# Patient Record
Sex: Female | Born: 1985 | Race: Black or African American | Hispanic: No | Marital: Married | Smoking: Never smoker
Health system: Southern US, Community
[De-identification: ages and names within clinical notes are randomized; demographics above are authoritative.]

## PROBLEM LIST (undated history)

## (undated) DIAGNOSIS — Z789 Other specified health status: Secondary | ICD-10-CM

## (undated) DIAGNOSIS — I10 Essential (primary) hypertension: Secondary | ICD-10-CM

## (undated) DIAGNOSIS — I82409 Acute embolism and thrombosis of unspecified deep veins of unspecified lower extremity: Secondary | ICD-10-CM

## (undated) HISTORY — PX: TONSILLECTOMY: SUR1361

## (undated) HISTORY — DX: Acute embolism and thrombosis of unspecified deep veins of unspecified lower extremity: I82.409

## (undated) HISTORY — DX: Other specified health status: Z78.9

## (undated) HISTORY — PX: OTHER SURGICAL HISTORY: SHX169

---

## 2006-02-22 ENCOUNTER — Emergency Department (HOSPITAL_COMMUNITY): Admission: EM | Admit: 2006-02-22 | Discharge: 2006-02-23 | Payer: Self-pay | Admitting: Emergency Medicine

## 2006-05-03 ENCOUNTER — Inpatient Hospital Stay (HOSPITAL_COMMUNITY): Admission: AD | Admit: 2006-05-03 | Discharge: 2006-05-03 | Payer: Self-pay | Admitting: Gynecology

## 2006-05-31 ENCOUNTER — Inpatient Hospital Stay (HOSPITAL_COMMUNITY): Admission: AD | Admit: 2006-05-31 | Discharge: 2006-05-31 | Payer: Self-pay | Admitting: Obstetrics & Gynecology

## 2006-05-31 ENCOUNTER — Ambulatory Visit: Payer: Self-pay | Admitting: Family Medicine

## 2006-06-14 ENCOUNTER — Inpatient Hospital Stay (HOSPITAL_COMMUNITY): Admission: AD | Admit: 2006-06-14 | Discharge: 2006-06-14 | Payer: Self-pay | Admitting: Gynecology

## 2006-06-30 ENCOUNTER — Ambulatory Visit: Payer: Self-pay | Admitting: Obstetrics & Gynecology

## 2006-07-01 ENCOUNTER — Ambulatory Visit: Payer: Self-pay | Admitting: Family Medicine

## 2006-07-01 ENCOUNTER — Ambulatory Visit (HOSPITAL_COMMUNITY): Admission: RE | Admit: 2006-07-01 | Discharge: 2006-07-01 | Payer: Self-pay | Admitting: Family Medicine

## 2006-07-04 ENCOUNTER — Encounter (INDEPENDENT_AMBULATORY_CARE_PROVIDER_SITE_OTHER): Payer: Self-pay | Admitting: *Deleted

## 2006-07-04 ENCOUNTER — Inpatient Hospital Stay (HOSPITAL_COMMUNITY): Admission: RE | Admit: 2006-07-04 | Discharge: 2006-07-07 | Payer: Self-pay | Admitting: Family Medicine

## 2006-07-04 ENCOUNTER — Ambulatory Visit: Payer: Self-pay | Admitting: Family Medicine

## 2006-07-13 ENCOUNTER — Inpatient Hospital Stay (HOSPITAL_COMMUNITY): Admission: AD | Admit: 2006-07-13 | Discharge: 2006-07-13 | Payer: Self-pay | Admitting: Obstetrics and Gynecology

## 2007-09-25 ENCOUNTER — Inpatient Hospital Stay (HOSPITAL_COMMUNITY): Admission: AD | Admit: 2007-09-25 | Discharge: 2007-09-25 | Payer: Self-pay | Admitting: Obstetrics and Gynecology

## 2007-11-15 ENCOUNTER — Inpatient Hospital Stay (HOSPITAL_COMMUNITY): Admission: AD | Admit: 2007-11-15 | Discharge: 2007-11-15 | Payer: Self-pay | Admitting: Obstetrics and Gynecology

## 2007-11-21 ENCOUNTER — Inpatient Hospital Stay (HOSPITAL_COMMUNITY): Admission: AD | Admit: 2007-11-21 | Discharge: 2007-11-21 | Payer: Self-pay | Admitting: Obstetrics and Gynecology

## 2007-12-01 ENCOUNTER — Encounter (INDEPENDENT_AMBULATORY_CARE_PROVIDER_SITE_OTHER): Payer: Self-pay | Admitting: Obstetrics and Gynecology

## 2007-12-01 ENCOUNTER — Inpatient Hospital Stay (HOSPITAL_COMMUNITY): Admission: RE | Admit: 2007-12-01 | Discharge: 2007-12-04 | Payer: Self-pay | Admitting: Obstetrics and Gynecology

## 2008-04-03 IMAGING — US US FETAL BPP W/O NONSTRESS
1 series · 18 of 23 positions shown · non-contrast
Comparison: none

CLINICAL DATA: 20-year-old.  G3 P2 with decreased fetal movement.  By assigned EDC of 07/15/06 the patient is 33 weeks and 4 days.

[Series 1: us fetal bpp w/o nonstress · non-contrast · 18 of 23 slices shown]
[im 1/23]
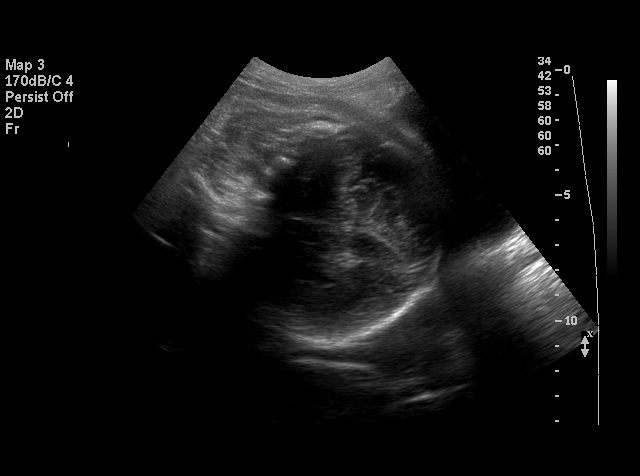
[im 2/23]
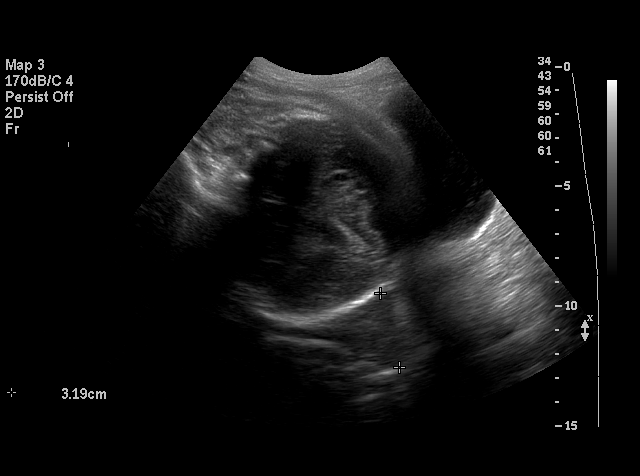
[im 4/23]
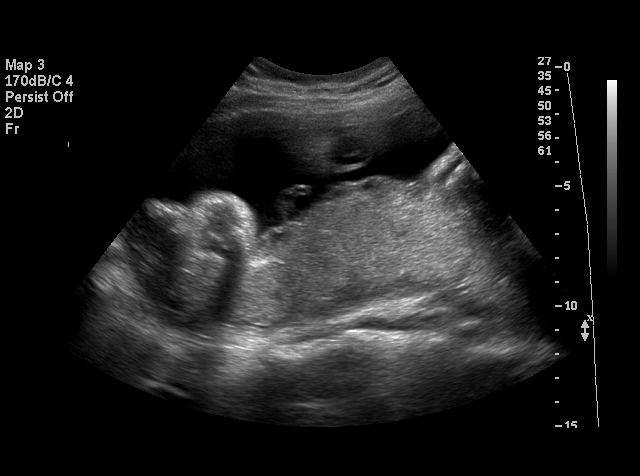
[im 5/23]
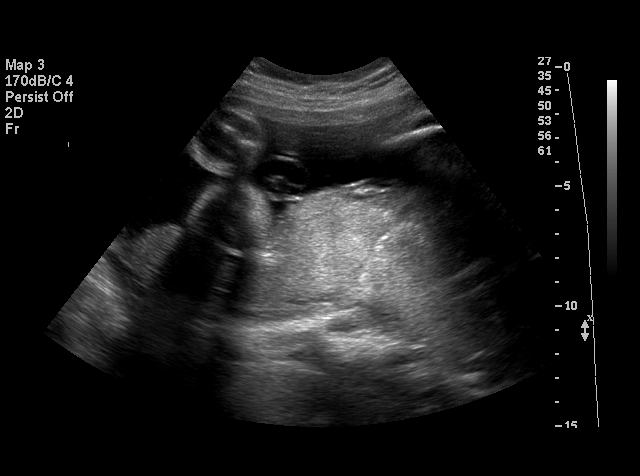
[im 6/23]
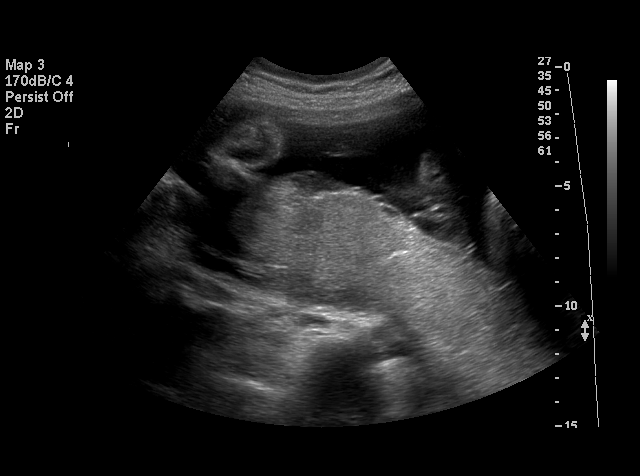
[im 8/23]
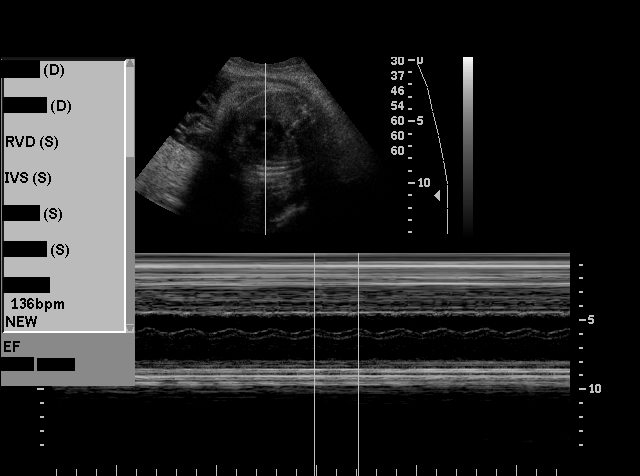
[im 9/23]
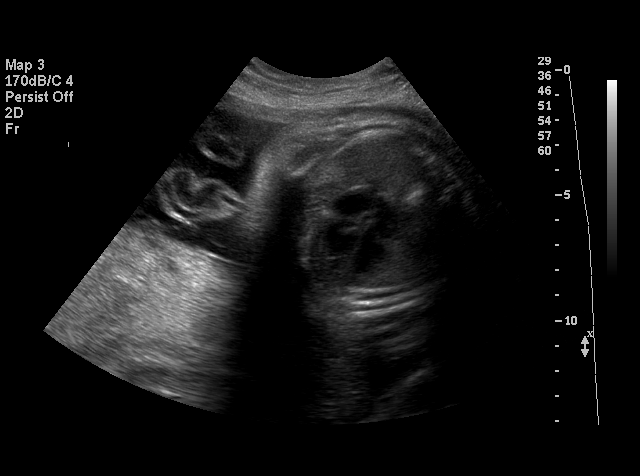
[im 10/23]
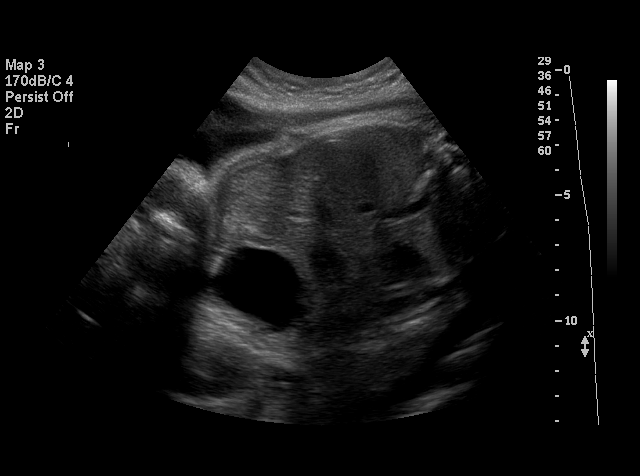
[im 11/23]
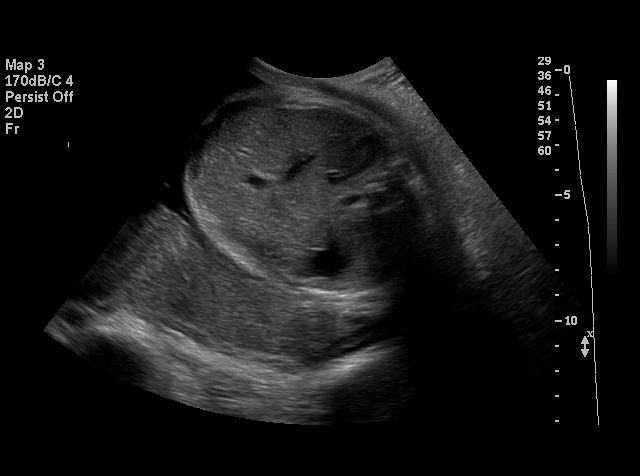
[im 13/23]
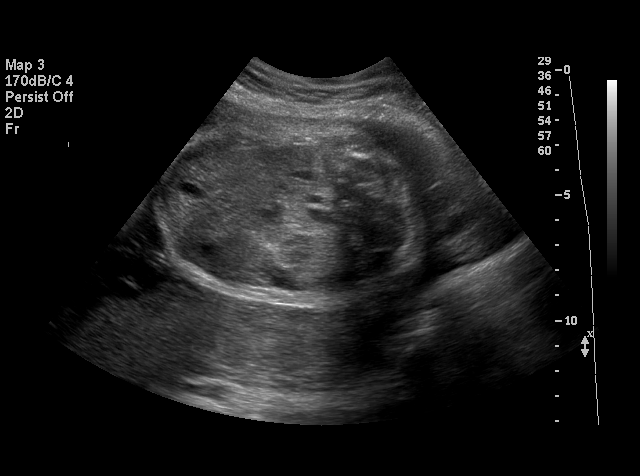
[im 14/23]
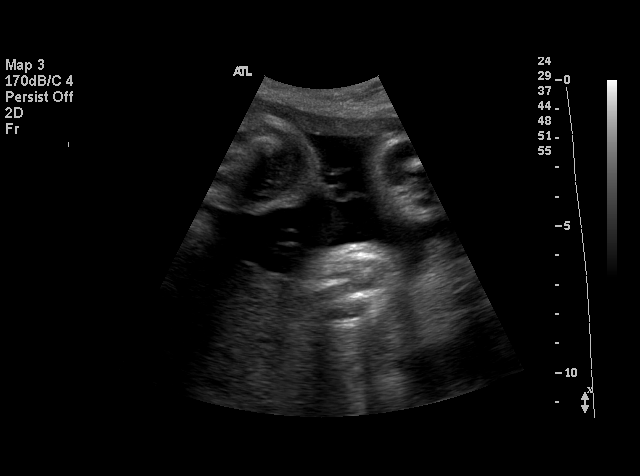
[im 15/23]
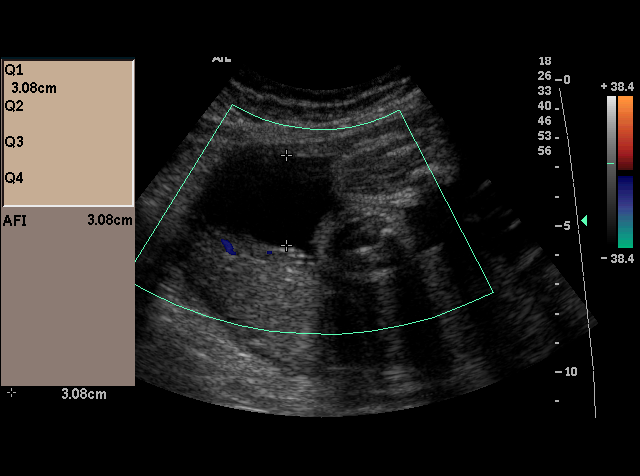
[im 16/23]
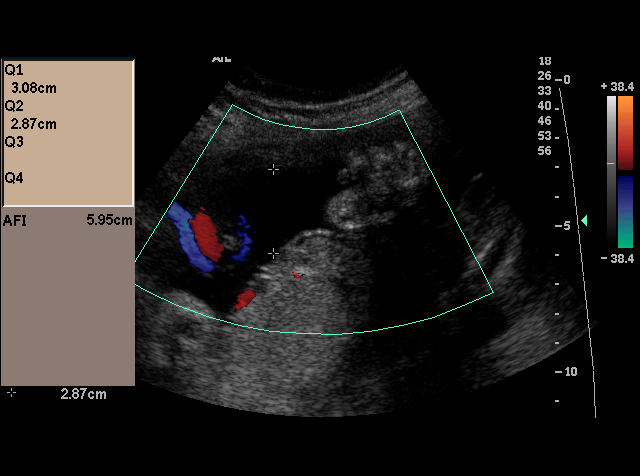
[im 18/23]
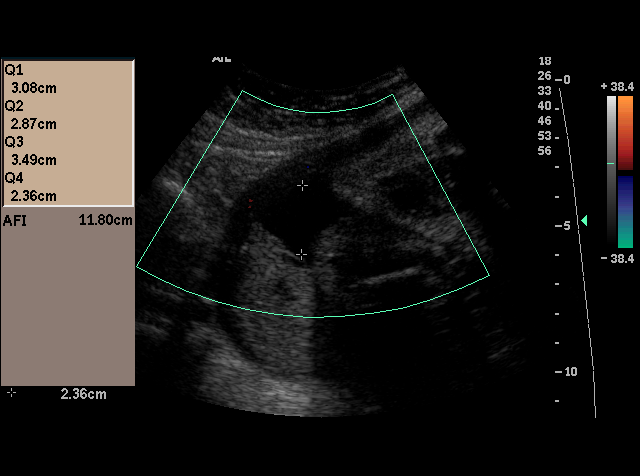
[im 19/23]
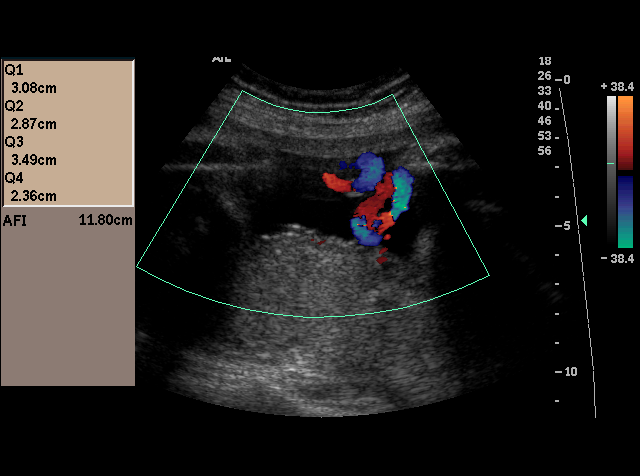
[im 20/23]
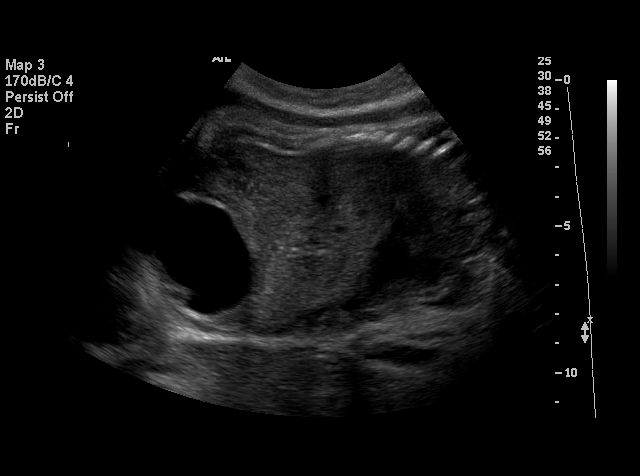
[im 22/23]
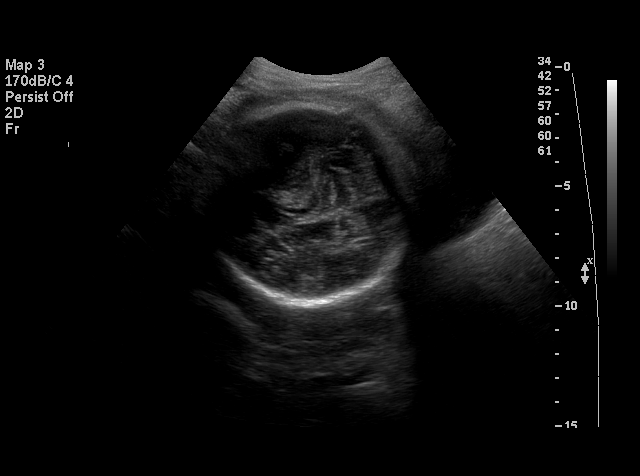
[im 23/23]
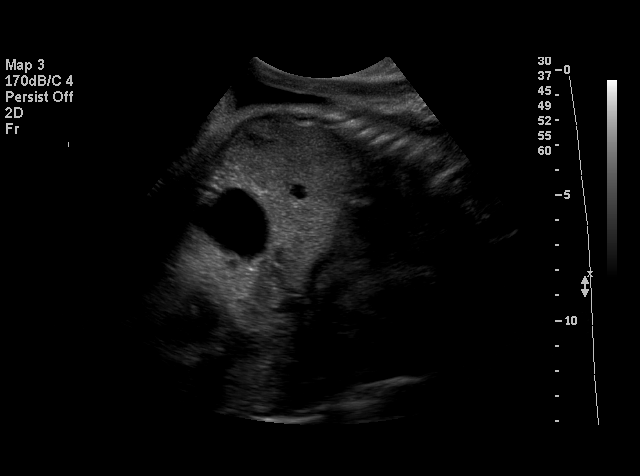

[18 of 23 positions shown; findings below may reference images not displayed]

BIOPHYSICAL PROFILE

 Number of Fetuses: 1
 Heart rate:  136
 Movement:  Yes
 Breathing:  Yes
 Presentation:  Cephalic
 Placental Location:  Posterior
 Grade: I
 Previa:  No
 Amniotic Fluid (Subjective):  Normal
 Amniotic Fluid (Objective):  11.8 cm AFI (5th -95th%ile = 8.1 ? 24.8 cm for 34 wks)

 Fetal measurements and complete anatomic evaluation were not requested.  The following fetal anatomy was visualized on this exam:  Lateral ventricles, stomach, 3-vessel cord, kidneys, and bladder.

 BPP SCORING
 Movements:  2  Time:  30 minutes
 Breathing:  2
 Tone:  2
 Amniotic Fluid:  2
 Total Score:  8

 MATERNAL UTERINE AND ADNEXAL FINDINGS
 Cervix:  3.2 cm transabdominally
IMPRESSION: Single living intrauterine fetus in cephalic presentation.  Amniotic fluid volume is within normal limits.  Biophysical profile is 8 of 8 in 30 minutes.

## 2008-12-29 ENCOUNTER — Emergency Department (HOSPITAL_COMMUNITY): Admission: EM | Admit: 2008-12-29 | Discharge: 2008-12-29 | Payer: Self-pay | Admitting: Emergency Medicine

## 2009-02-05 ENCOUNTER — Emergency Department (HOSPITAL_COMMUNITY): Admission: EM | Admit: 2009-02-05 | Discharge: 2009-02-05 | Payer: Self-pay | Admitting: Emergency Medicine

## 2009-08-23 ENCOUNTER — Inpatient Hospital Stay (HOSPITAL_COMMUNITY): Admission: AD | Admit: 2009-08-23 | Discharge: 2009-08-23 | Payer: Self-pay | Admitting: Obstetrics & Gynecology

## 2009-10-19 ENCOUNTER — Emergency Department (HOSPITAL_COMMUNITY): Admission: EM | Admit: 2009-10-19 | Discharge: 2009-10-19 | Payer: Self-pay | Admitting: Emergency Medicine

## 2010-01-29 ENCOUNTER — Ambulatory Visit (HOSPITAL_COMMUNITY): Admission: RE | Admit: 2010-01-29 | Discharge: 2010-01-29 | Payer: Self-pay | Admitting: Obstetrics

## 2010-01-30 ENCOUNTER — Ambulatory Visit (HOSPITAL_COMMUNITY): Admission: RE | Admit: 2010-01-30 | Discharge: 2010-01-30 | Payer: Self-pay | Admitting: Obstetrics & Gynecology

## 2010-01-30 ENCOUNTER — Ambulatory Visit: Payer: Self-pay | Admitting: Vascular Surgery

## 2010-01-30 ENCOUNTER — Encounter: Payer: Self-pay | Admitting: Obstetrics & Gynecology

## 2010-02-05 ENCOUNTER — Ambulatory Visit: Payer: Self-pay | Admitting: Oncology

## 2010-02-10 ENCOUNTER — Ambulatory Visit: Payer: Self-pay | Admitting: Hematology & Oncology

## 2010-02-20 LAB — CHCC SATELLITE - SMEAR

## 2010-02-20 LAB — CBC WITH DIFFERENTIAL (CANCER CENTER ONLY)
BASO#: 0 10*3/uL (ref 0.0–0.2)
BASO%: 0.6 % (ref 0.0–2.0)
LYMPH#: 2.3 10*3/uL (ref 0.9–3.3)
LYMPH%: 42.5 % (ref 14.0–48.0)
MCH: 21.9 pg — ABNORMAL LOW (ref 26.0–34.0)
MCHC: 31.8 g/dL — ABNORMAL LOW (ref 32.0–36.0)
MCV: 69 fL — ABNORMAL LOW (ref 81–101)
MONO#: 0.3 10*3/uL (ref 0.1–0.9)
NEUT#: 2.5 10*3/uL (ref 1.5–6.5)
NEUT%: 47.6 % (ref 39.6–80.0)
RDW: 16.7 % — ABNORMAL HIGH (ref 10.5–14.6)

## 2010-03-01 LAB — JAK2 GENOTYPR: JAK2 GenotypR: NOT DETECTED

## 2010-03-14 ENCOUNTER — Ambulatory Visit: Payer: Self-pay | Admitting: Hematology & Oncology

## 2010-04-03 ENCOUNTER — Emergency Department (HOSPITAL_COMMUNITY): Admission: EM | Admit: 2010-04-03 | Discharge: 2010-04-03 | Payer: Self-pay | Admitting: Emergency Medicine

## 2010-11-20 ENCOUNTER — Emergency Department (HOSPITAL_COMMUNITY)
Admission: EM | Admit: 2010-11-20 | Discharge: 2010-11-20 | Payer: Self-pay | Source: Home / Self Care | Admitting: Family Medicine

## 2010-11-28 ENCOUNTER — Ambulatory Visit (HOSPITAL_COMMUNITY)
Admission: RE | Admit: 2010-11-28 | Discharge: 2010-11-28 | Payer: Self-pay | Source: Home / Self Care | Attending: Obstetrics & Gynecology | Admitting: Obstetrics & Gynecology

## 2010-11-29 NOTE — Op Note (Signed)
  Elizabeth Chen, Elizabeth Chen            ACCOUNT NO.:  [id number]  MEDICAL RECORD NO.:  [id number]          PATIENT TYPE:  AMB  LOCATION:  SDC                           FACILITY:  [facility]  PHYSICIAN:  Roseanna Rainbow, M.D.DATE OF BIRTH:  12-11-1985  DATE OF PROCEDURE:  11/28/2010 DATE OF DISCHARGE:  11/28/2010                              OPERATIVE REPORT   PREOPERATIVE DIAGNOSIS:  Abnormal uterine bleeding.  POSTOPERATIVE DIAGNOSIS:  Abnormal uterine bleeding.  PROCEDURES:  Diagnostic dilatation and curettage, hysteroscopy, NovaSure endometrial ablation.  SURGEON:  Roseanna Rainbow, M.D.  ANESTHESIA:  Laryngeal mask airway, paracervical block.  URINE OUTPUT:  200 mL of clear urine at the beginning of the procedure.  IV FLUIDS:  As per anesthesiology.  FINDINGS:  The endometrium appeared lush.  The tubal ostia were visualized bilaterally.  There were no discrete lesions noted. Pathology endometrial curettings.  All specimens sent to Pathology.  ESTIMATED BLOOD LOSS:  100 mL.  COMPLICATIONS:  Cervical laceration from the tenaculum.  PROCEDURE:  The patient was taken to the operating room with an IV running.  A laryngeal mask airway was then placed.  She was placed in the dorsal lithotomy position and prepped and draped in the usual sterile fashion.  After time-out had been completed, a speculum was placed in the patient's vagina.  The anterior lip of the cervix was then infiltrated with 2 mL of 2% Xylocaine plain.  The single-tooth tenaculum was then applied to this location.  A 4 mL of 2% lidocaine plain were then injected at 4 and 7 o'clock to produce a paracervical block.  The cervical length was then assessed.  The cervix was dilated with Shawnie Pons dilators.  The hysteroscope was then introduced into the uterine cavity with the above findings noted.  The hysteroscope was then removed.  The uterus was sounded.  A sharp curettage was then performed.  The NovaSure device  was then introduced into the uterine cavity.  The device was seated.  On the initial attempt, the cavity integrity test could not be passed.  However, there was tubing connection that was not connected properly, however.  When the tubing connection was connected properly, the cavity integrity test was passed.  The ablation cycle was started and completed.  The hysteroscope was then reintroduced into the uterine cavity with a good result noted.  The above-noted cervical laceration was repaired with an interrupted figure- of-eight suture of 2-0 Monocryl.  The single-tooth tenaculum was removed with minimal bleeding noted from the cervix.  At the close of the procedure, the instrument and pack counts were said to be correct x2. The patient was taken to the PACU awake and in stable condition.     Roseanna Rainbow, M.D.     [name]  D:  11/28/2010  T:  11/29/2010  Job:  [id number]  Electronically Signed by Antionette Char M.D. on 11/29/2010 02:19:37 PM

## 2010-11-30 ENCOUNTER — Inpatient Hospital Stay (HOSPITAL_COMMUNITY)
Admission: AD | Admit: 2010-11-30 | Discharge: 2010-12-04 | Payer: Self-pay | Source: Home / Self Care | Attending: Obstetrics & Gynecology | Admitting: Obstetrics & Gynecology

## 2010-12-01 LAB — CBC
Hemoglobin: 12.4 g/dL (ref 12.0–15.0)
MCH: 24.9 pg — ABNORMAL LOW (ref 26.0–34.0)
WBC: 6.2 10*3/uL (ref 4.0–10.5)

## 2010-12-02 LAB — BASIC METABOLIC PANEL
CO2: 28 mEq/L (ref 19–32)
Creatinine, Ser: 0.92 mg/dL (ref 0.4–1.2)
Potassium: 3.4 mEq/L — ABNORMAL LOW (ref 3.5–5.1)
Sodium: 137 mEq/L (ref 135–145)

## 2010-12-02 LAB — CBC
HCT: 35 % — ABNORMAL LOW (ref 36.0–46.0)
HCT: 31.5 % — ABNORMAL LOW (ref 36.0–46.0)
Hemoglobin: 11.1 g/dL — ABNORMAL LOW (ref 12.0–15.0)
MCH: 25.2 pg — ABNORMAL LOW (ref 26.0–34.0)
MCHC: 32.1 g/dL (ref 30.0–36.0)
MCV: 79.4 fL (ref 78.0–100.0)
Platelets: 252 10*3/uL (ref 150–400)
RBC: 4.41 MIL/uL (ref 3.87–5.11)
RBC: 3.97 MIL/uL (ref 3.87–5.11)
RDW: 13.2 % (ref 11.5–15.5)
RDW: 13.3 % (ref 11.5–15.5)
WBC: 16.5 10*3/uL — ABNORMAL HIGH (ref 4.0–10.5)

## 2010-12-02 LAB — DIFFERENTIAL
Basophils Absolute: 0.1 10*3/uL (ref 0.0–0.1)
Neutro Abs: 18 10*3/uL — ABNORMAL HIGH (ref 1.7–7.7)
Neutrophils Relative %: 86 % — ABNORMAL HIGH (ref 43–77)

## 2010-12-03 NOTE — H&P (Addendum)
  NAMEBRAYLI, Elizabeth Chen            ACCOUNT NO.:  [id number]  MEDICAL RECORD NO.:  [id number]          PATIENT TYPE:  OBV  LOCATION:  9318                          [facility]ACILITY:  [facility]  PHYSICIAN:  Roseanna Rainbow, M.D.DATE OF BIRTH:  1986-02-04  DATE OF ADMISSION:  11/30/2010 DATE OF DISCHARGE:                             HISTORY & PHYSICAL   CHIEF COMPLAINT:  The patient is a 25 year old several-day status post a NovaSure endometrial ablation complaining of headache, nausea, vomiting, and chills.  HISTORY OF PRESENT ILLNESS:  Please see the above.  She reports significant pelvic cramping 1 day prior to presentation.  This had improved on the day of presentation.  PAST MEDICAL HISTORY:  There is a history of deep venous thrombosis in March of 2011.  She was on Coumadin for 6 months, no anticoagulants at present. Migraine headaches.  PAST SURGICAL HISTORY:  Cesarean delivery.  Please see the above . Bilateral tubal ligation.  SOCIAL HISTORY:  She is a nonsmoker, nondrinker.  No recreational drugs. Lives with his daughter and spouse.  PAST GYN HISTORY:  No history of sexually transmitted infections.  MEDICATIONS:  Please see the medication reconciliation form.  ALLERGIES:  LATEX.  REVIEW OF SYSTEMS:  GENERAL:  Chills.  GI: Nausea, vomiting, diarrhea. GENITOURINARY:  Pelvic cramping.  She denies significant vaginal bleeding or vaginal discharge.  PHYSICAL EXAMINATION:  VITAL SIGNS:  Temperature 101.2, pulse 119, respirations 18, blood pressure 110/69. GENERAL:  Well-nourished, uncomfortable appearing. ABDOMEN:  Soft.  Normoactive bowel sounds.  No rebound or guarding. PELVIC:  Per the CNM.  LABORATORY DATA:  Basic metabolic profile normal.  White blood cell count 21.2, hemoglobin 11.1, platelets 252,000.  Abdominal x-ray, no free air.  Otherwise negative study.  ASSESSMENT:  Status post NovaSure ablation with endometritis, possible endomyometritis, doubt perforation,  also with a migraine headache.  PLAN:  Admission, parenteral antibiotics, supportive care, serial exams, and lab work.     Roseanna Rainbow, M.D.     Elizabeth Chen  D:  12/01/2010  T:  12/01/2010  Job:  [id number]  Electronically Signed by Antionette Char M.D. on 12/03/2010 09:23:03 PM

## 2010-12-07 LAB — CULTURE, BLOOD (ROUTINE X 2)
Culture  Setup Time: 201201230006
Culture: NO GROWTH
Culture: NO GROWTH

## 2010-12-08 NOTE — Discharge Summary (Signed)
  Elizabeth Chen, Elizabeth Chen            ACCOUNT NO.:  [id number]  MEDICAL RECORD NO.:  [id number]          PATIENT TYPE:  INP  LOCATION:  9318                          [facility]ILITY:  [facility]  PHYSICIAN:  Roseanna Rainbow, M.D.DATE OF BIRTH:  [birth year]  DATE OF ADMISSION:  11/30/2010 DATE OF DISCHARGE:  12/04/2010                              DISCHARGE SUMMARY   CHIEF COMPLAINT:  The patient is a 25 year old several days status post NovaSure endometrial ablation, complaining of headache, nausea, vomiting, and chills.  Please see the dictated history and physical for further details.  HOSPITAL COURSE:  The patient was admitted.  She was started on broad- spectrum parenteral antibiotics.  The patient defervesced and remained afebrile for the remainder of her hospital course.  Her white blood cell count was trending downward.  An initial white blood cell count on November 30, 2010, was 21.2; on December 01, 2010, a white blood cell count was 16.5.  The abdominal exam remained stable as well.  Blood cultures were negative.  DISCHARGE DIAGNOSIS:  Endomyometritis status post NovaSure ablation.  CONDITION:  Improved, stable.  DIET:  Regular.  ACTIVITY:  Pelvic rest.  MEDICATIONS: 1. Augmentin 875 mg 1 tab p.o. b.i.d. 2. Over-the-counter ibuprofen as needed. 3. Percocet 1-2 tabs every 6 hours as needed.  DISPOSITION:  The patient was to follow up in the office in 2 weeks.     Roseanna Rainbow, M.D.     [name]  D:  12/04/2010  T:  12/04/2010  Job:  [id number]  Electronically Signed by Antionette Char M.D. on 12/08/2010 09:19:20 AM

## 2011-01-26 LAB — POCT I-STAT, CHEM 8
BUN: 7 mg/dL (ref 6–23)
Calcium, Ion: 1.11 mmol/L — ABNORMAL LOW (ref 1.12–1.32)
Chloride: 105 mEq/L (ref 96–112)
Glucose, Bld: 81 mg/dL (ref 70–99)
HCT: 42 % (ref 36.0–46.0)
Sodium: 140 mEq/L (ref 135–145)

## 2011-01-26 LAB — URINALYSIS, ROUTINE W REFLEX MICROSCOPIC
Specific Gravity, Urine: 1.018 (ref 1.005–1.030)
pH: 8.5 — ABNORMAL HIGH (ref 5.0–8.0)

## 2011-01-26 LAB — POCT PREGNANCY, URINE: Preg Test, Ur: NEGATIVE

## 2011-01-26 LAB — URINE MICROSCOPIC-ADD ON

## 2011-02-10 LAB — POCT I-STAT, CHEM 8
Creatinine, Ser: 0.7 mg/dL (ref 0.4–1.2)
Glucose, Bld: 75 mg/dL (ref 70–99)
Hemoglobin: 13.6 g/dL (ref 12.0–15.0)
Potassium: 3 mEq/L — ABNORMAL LOW (ref 3.5–5.1)

## 2011-02-12 LAB — CBC
HCT: 34.2 % — ABNORMAL LOW (ref 36.0–46.0)
Hemoglobin: 10.8 g/dL — ABNORMAL LOW (ref 12.0–15.0)
MCHC: 31.6 g/dL (ref 30.0–36.0)
MCV: 76.7 fL — ABNORMAL LOW (ref 78.0–100.0)
Platelets: 290 10*3/uL (ref 150–400)
RBC: 4.45 MIL/uL (ref 3.87–5.11)
RDW: 16.8 % — ABNORMAL HIGH (ref 11.5–15.5)
WBC: 11.2 10*3/uL — ABNORMAL HIGH (ref 4.0–10.5)

## 2011-02-12 LAB — URINALYSIS, ROUTINE W REFLEX MICROSCOPIC
Bilirubin Urine: NEGATIVE
Hgb urine dipstick: NEGATIVE
Specific Gravity, Urine: 1.01 (ref 1.005–1.030)
Urobilinogen, UA: 0.2 mg/dL (ref 0.0–1.0)
pH: 8 (ref 5.0–8.0)

## 2011-02-12 LAB — WET PREP, GENITAL
Trich, Wet Prep: NONE SEEN
Yeast Wet Prep HPF POC: NONE SEEN

## 2011-02-12 LAB — GC/CHLAMYDIA PROBE AMP, GENITAL: GC Probe Amp, Genital: NEGATIVE

## 2011-02-12 LAB — HCG, SERUM, QUALITATIVE: Preg, Serum: NEGATIVE

## 2011-03-24 NOTE — Op Note (Signed)
NAMECOLLEN, HOSTLER            ACCOUNT NO.:  [id number]   MEDICAL RECORD NO.:  [id number]          PATIENT TYPE:  INP   LOCATION:  9199                          [facility]ACILITY:  [facility]   PHYSICIAN:  Juluis Mire, M.D.   DATE OF BIRTH:  06-24-1986   DATE OF PROCEDURE:  12/01/2007  DATE OF DISCHARGE:                               OPERATIVE REPORT   PREOPERATIVE DIAGNOSES:  1. Intrauterine pregnancy at 37+ weeks.  2. Repeat cesarean section.  3. Fetal pulmonary maturity.  4. Multiparity, desires sterility.   POSTOPERATIVE DIAGNOSES:  1. Intrauterine pregnancy at 37+ weeks.  2. Repeat cesarean section.  3. Fetal pulmonary maturity.  4. Multiparity, desires sterility.   OPERATIVE PROCEDURES:  1. Low transverse cesarean section.  2. Bilateral tubal ligation.   SURGEON:  Juluis Mire, M.D.   ANESTHESIA:  Spinal.   ESTIMATED BLOOD LOSS:  400-500 mL.   PACKS AND DRAINS:  None.   INTRAOPERATIVE BLOOD REPLACED:  None.   COMPLICATIONS:  None.   INDICATIONS:  Are as dictated in the history and physical.   PROCEDURE:  The patient was taken the OR, placed in supine position with  left lateral tilt.  After a satisfactory level of spinal anesthesia was  obtained, the abdomen was prepped out with Betadine and draped as a  sterile field.  A prior low transverse skin incision was identified and  excised.  The incision was then extended through subcutaneous tissue.  Rectus fascia was entered sharply and the incision in the fascia  extended laterally.  Fascia was taken off the muscle superiorly and  inferiorly.  Rectus muscles were separated in the midline.  Peritoneum  was entered sharply and the incision in the perineum extended both  superiorly and inferiorly.  A low transverse bladder flap developed.  A  low transverse uterine incision was begun with a knife and extended  laterally using manual traction.  The infant was in the vertex  presentation and was delivered with elevation of  the head and fundal  pressure.  The infant was a viable female who weighed 8 pounds 5 ounces,  Apgars were 8/9.  The pH is pending.  Placenta was delivered manually.  Uterus was exteriorized for closure.  Uterus closed with a running  locking suture of 0 chromic using a two-layer closure technique.  Good  hemostasis was noted.  Urine output was clear.   Tubes were identified.  A midsegment of each tube was elevated.  A hole  was made in the avascular area of the mesosalpinx using the cautery.  Individual ligatures of 0 plain catgut were used to ligate off a segment  of tube.  The intervening segment tube was excised.  The cut ends of the  tubes were cauterized using the Bovie.  We had good hemostasis.  Both  ovaries were normal.  The uterus was returned to the abdominal cavity.  We irrigated the pelvis, had good hemostasis, clear urine output.  Muscles and peritoneum closed with a running suture of 3-0 Vicryl.  Fascia was closed with a running suture of 0 PDS.  Skin was closed with  staples and Steri-Strips.  Sponge, instrument and needle count reported  as correct by circulating nurse x2.  Foley catheter remained clear at  time of closure.  The patient did tolerate the procedure well, was  returned to the recovery room in good condition.      Juluis Mire, M.D.  Electronically Signed     JSM/MEDQ  D:  12/01/2007  T:  12/01/2007  Job:  [id number]

## 2011-03-24 NOTE — H&P (Signed)
Elizabeth Chen, Elizabeth Chen            ACCOUNT NO.:  [id number]   MEDICAL RECORD NO.:  [id number]          PATIENT TYPE:  INP   LOCATION:  NA                            [facility]   PHYSICIAN:  Juluis Mire, M.D.   DATE OF BIRTH:  Nov 07, 1986   DATE OF ADMISSION:  12/01/2007  DATE OF DISCHARGE:                              HISTORY & PHYSICAL   The patient is a 25 year old gravida 4, para 3 female whose last  menstrual period gives her an estimated date of confinement of December 14, 2007.  This is consistent with initial exam, and this gives her an  estimated gestational age of approximately 37-38 weeks.  She presents  for repeat cesarean section.   The patient had a previous cesarean section with the last pregnancy.  We  had discussed a trial of labor declined by the patient.  She wishes  repeat cesarean section.  She also desires permanent sterilization at  the present time.  Because of the discomfort, she wanted a section done  before 39 weeks.  An amniocentesis was performed with a mature LS and  PG.  Last estimated fetal weight was 8 pounds 9 ounces.  Amniotic fluid  was in the 15th percentile.  Recent nonstress test was reactive.   ALLERGIES:  She is allergic to LATEX.   MEDICATIONS:  Prenatal vitamins.   PAST MEDICAL HISTORY/FAMILY HISTORY/SOCIAL HISTORY:  Please see prenatal  records.   REVIEW OF SYSTEMS:  Noncontributory.   PHYSICAL EXAMINATION:  VITAL SIGNS:  The patient is afebrile with stable  vital signs.  HEENT:  The patient is normocephalic.  Pupils equal, round and reactive  to light and accommodation.  Extraocular movements were intact.  Sclerae  and conjunctivae were clear.  Oropharynx clear.  NECK:  Without thyromegaly.  BREASTS:  Not examined.  LUNGS:  Clear.  CARDIOVASCULAR:  Regular rhythm and rate with a grade 2/6 systolic  ejection murmur.  No clicks or gallops.  ABDOMEN:  Gravid uterus consistent with dates.  PELVIC:  Cervix is 2 cm, 50% effaced,  vertex presenting.  EXTREMITIES:  Trace edema.  NEUROLOGIC:  Grossly within normal limits.   IMPRESSION:  1. Intrauterine pregnancy at 37 weeks.  Mature fetal pulmonary      studies.  2. Previous cesarean section, desires repeat.  3. Multiparity; desires sterility.   PLAN:  The patient will undergo repeat cesarean section.  The risks have  been explained including the risk of infection, the risk of hemorrhage  that could require transfusion with the risk of AIDS or hepatitis, the  risk of injury to adjacent organs including bladder, bowel or ureters  that could require further exploratory surgery, the risk of deep  venous thrombosis and pulmonary embolus.  Potential irreversibility of  sterilization explained.  The failure rate of 1 in 300 was quoted with  risk being in the form of ectopic pregnancy requiring further surgical  management.  Alternatives for birth control have been explained.      Juluis Mire, M.D.  Electronically Signed     JSM/MEDQ  D:  12/01/2007  T:  12/01/2007  Job:  [id number]

## 2011-03-27 NOTE — Discharge Summary (Signed)
NAMECLEMENCE, Elizabeth Chen            ACCOUNT NO.:  [id number]   MEDICAL RECORD NO.:  [id number]          PATIENT TYPE:  INP   LOCATION:  9103                          [facility]   PHYSICIAN:  Michelle L. Grewal, M.D.DATE OF BIRTH:  06/03/1986   DATE OF ADMISSION:  12/01/2007  DATE OF DISCHARGE:  12/04/2007                               DISCHARGE SUMMARY   ADMISSION DIAGNOSES:  1. Intrauterine pregnancy at 37+ weeks' estimated gestational age.  2. Previous cesarean section.  3. Fetal lung maturity.  4. Multiparity, desires permanent sterilization.   DISCHARGE DIAGNOSES:  1. Status post low transverse cesarean section.  2. A viable female infant.  3. Permanent sterilization.   PROCEDURES:  1. Low transverse cesarean section.  2. Bilateral tubal ligation.   REASON FOR ADMISSION:  Please see dictated H&P.   HOSPITAL COURSE:  The patient was a 25 year old gravida 4, para 3, that  was admitted to [facility] for a scheduled cesarean  section.  The patient had undergone amniocentesis which had revealed  mature LS and PG.  The patient due to multiparity had also requested  permanent sterilization.  On the morning of admission the patient was  taken to the operating room. where spinal anesthesia was administered  without difficulty.  A low transverse incision was made with delivery of  a viable female infant weighing 8 pounds 5 ounces, Apgars of 8 at one  minute, 9 at 5 minutes.  The patient tolerated the procedure well and  was taken to the recovery room in stable condition.  On postoperative  day #1 the patient was without complaint.  Vital signs were stable.  She  was afebrile.  Fundus firm and nontender.  Abdominal dressing was noted  to have a small amount of drainage noted on the bandage.  Foley had been  discontinued and the patient was voiding well.  Laboratory findings  revealed hemoglobin of 7.4, platelet count of 262,000, WBC count of  16.4.  Hemoglobin  on admission was 8.6.  The patient was started on some  iron supplementation and IV was reduced to a saline lock.  On  postoperative day #2 the patient was doing well.  She denied any  dizziness.  Vital signs were stable.  She is afebrile.  Abdomen soft.  Fundus firm and nontender.  Abdominal dressing had been removed  revealing an incision that was clean, dry and intact.  Later that  evening the patient did complain of some diarrhea.  The patient was  started on some Lomotil.  On the following morning the diarrhea was  improved and vital signs were stable.  She was afebrile.  Fundus firm  and nontender.  Incision was clean, dry and intact.  Staples removed and  the patient was later discharged home.   CONDITION ON DISCHARGE:  Good.   DIET:  Regular as tolerated.   ACTIVITY:  No heavy lifting, no driving x2 weeks, no vaginal entry.   FOLLOW-UP:  Patient to follow up in the office in 1 week for an incision  check.  She is to call for a temperature greater than 100  degrees,  persistent nausea or vomiting, heavy vaginal bleeding and/or redness or  drainage from the incisional site.   DISCHARGE MEDICATIONS:  1. Tylox #30 one p.o. every 4-6 hours p.r.n.  2. Motrin 600 mg every 6 hours.  3. Ferrous sulfate 325 mg one p.o. b.i.d.  4. Prenatal vitamins one p.o. daily.      Julio Sicks, N.P.      Stann Mainland. Vincente Poli, M.D.  Electronically Signed    CC/MEDQ  D:  12/15/2007  T:  12/16/2007  Job:  [id number]

## 2011-03-27 NOTE — Discharge Summary (Signed)
Elizabeth Chen, FLIGHT            ACCOUNT NO.:  [id number]   MEDICAL RECORD NO.:  [id number]          PATIENT TYPE:  INP   LOCATION:  9125                          [facility]ILITY[facility]   PHYSICIAN:  Phil D. Okey Dupre, M.D.     DATE OF BIRTH:  01-18-1986   DATE OF ADMISSION:  07/04/2006  DATE OF DISCHARGE:  07/07/2006                                 DISCHARGE SUMMARY   REASON FOR ADMISSION:  Onset of labor.   PROCEDURES PRENATAL:  None.   PROCEDURES INTRAPARTUM:  Cesarean, low cervical transverse.   PROCEDURES POSTPARTUM:  None.   COMPLICATIONS, OPERATIVE AND POSTPARTUM:  None.   DISCHARGE DIAGNOSIS:  Term pregnancy, delivered.   BRIEF HOSPITAL COURSE:  Patient is a 25 year old G3, P2, 0-0-2 who presented  at [redacted] weeks EGA with spontaneous onset of labor.  The patient was admitted  to L&D per protocol.  In discussion of patient's obstetrics history, it was  noted the patient had a previous incident of a three minute shoulder  dystocia following precipitous labor in the [facility].  The patient had poor to no  prenatal care with this pregnancy.  Risks of a repeat shoulder dystocia were  discussed with patient, and the decision was made to take the patient to  elective cesarean section.  The patient delivered a viable female with  Apgars of 8 and 9 by cesarean section without intraoperative or  postoperative complications.  The patient was transferred to labor and  delivery following her surgery and had routine postoperative course.   Postoperatively, the patient was noted to have a decline in her hemoglobin  from 8.7 preop baseline to 7.2 postop.  Patient was asymptomatic and  hemodynamically stable.  The patient was sent home on iron supplementation  secondary to a hemoglobin less than 8.  Patient desires BTL for  contraception but did not have papers signed, so the patient is scheduled  for a two week preop followup at the GYN clinic with a six week interval  BTL.  Patient was covered with  Depo-Provera for contraception during this  interval.  Lastly, secondary to a concern of elevated blood sugars during  the hospital stay but documented only with a one hour Glucola of 135,  recommendation is also made for the patient to have a two hour glucose  tolerance test at 6 weeks postpartum.   DISCHARGE INSTRUCTIONS:  Activity limitation:  No heavy lifting for six  weeks.  Six weeks pelvic rest.  Routine diet.   DISCHARGE MEDICATIONS:  1. Ibuprofen 600 mg q.6h. p.r.n. pain.  2. Percocet 5/325 1-2 tabs every 4-6 hours as needed for pain.  3. Colace 100 mg b.i.d. for constipation.   DISCHARGE INSTRUCTIONS:  Routine discharge to home.   FOLLOW UP:  1. Two weeks at GYN clinic for preop interval BTL examination.  2. Six weeks at low risk clinic for routine postoperative followup.  3. Patient's staples were removed on postop day #3 prior to discharge.      Patient does not need followup for staple removal.      Towana Badger, M.D.    ______________________________  Phil D. Okey Dupre, M.D.    JP/MEDQ  D:  07/07/2006  T:  07/07/2006  Job:  [id number]

## 2011-03-27 NOTE — Op Note (Signed)
NAMEMALISHA, Elizabeth Chen            ACCOUNT NO.:  [id number]   MEDICAL RECORD NO.:  [id number]          PATIENT TYPE:  WOC   LOCATION:  WOC                          FACILITY:  [facility]   PHYSICIAN:  Tanya S. Shawnie Pons, M.D.   DATE OF BIRTH:  07/02/1986   DATE OF PROCEDURE:  07/04/2006  DATE OF DISCHARGE:                                 OPERATIVE REPORT   PREOPERATIVE DIAGNOSIS:  History of shoulder, in labor.   POSTOPERATIVE DIAGNOSIS:  History of shoulder, in labor.   PROCEDURE:  Primary low transverse cesarean section.   SURGEON:  Shelbie Proctor. Shawnie Pons, M.D.   ASSISTANT:  __________.   ANESTHESIA:  Spinal and local.   FINDINGS:  Viable female infant, Apgars 8 and 9, weight 7 pounds 6 ounces.   ESTIMATED BLOOD LOSS:  750 cubic centimeters.   COMPLICATIONS:  None.   SPECIMENS:  Placenta to pathology.   INDICATIONS:  The patient is an 25 year old, gravida 3 para 2, who had a 9-  pound 15-ounce infant with gestational diabetes in a previous pregnancy and  had a significant shoulder dystocia.  The patient was seen prior to this and  had discussed the risk of shoulder dystocia.  She had a recent ultrasound  that showed the baby to be approximately 7-1/2 pounds.  A full discussion  was held with the patient including risks, benefits, and risk of repeat  shoulder dystocia being very unlikely and the patient had previously had a 7-  pound infant without any difficulty.  However, the patient decided to  proceed with primary C-section.  The risks of this procedure, including  bleeding, need for transfusion, injury to bowel or bladder, infection, and  even death, were all discussed with the patient.   DESCRIPTION OF PROCEDURE:  The patient was taken to the OR.  She was placed  in the supine position.  After spinal anesthesia was administered, she was  prepped and draped in the usual sterile fashion.  When anesthesia was found  to be adequate with __________  testing, a Pfannenstiel incision was  made  with the knife to the underlying fascia which was nicked in the midline.  The fascial incision was extended laterally using Mayo scissors laterally.  The anterior portion of the fascia was dissected off the __________  and  rectus bluntly laterally and sharply in the midline.  The rectus was then  divided bluntly in the midline, the peritoneal cavity entered bluntly.  The  peritoneal incision was made larger by the surgeon and assistant.  A bladder  blade was placed inside the cavity.  A knife was used to make a low  transverse incision on the uterus.  The amniotic cavity was entered.  Clear  amniotic fluid was noted.  The infant was in the vertex presentation,  __________ bulb suctioned on the abdomen, and delivered without difficulty.  The cord was clamped x 2 and cut.  The infant was taken to awaiting  pediatrics.  Positive spontaneous cry heard.  Cord blood was obtained.  The  placenta was delivered easily and the uterus cleaned out with dry lap pads.  The  edges of the uterine incision were then grasped with ring forceps and  closed with 0-Vicryl incision in a locked running fashion.  A second  imbricating layer was then placed over this.  When hemostasis was found to  be adequate, attention was turned to the tubes and ovaries which appeared  normal.  Again, the uterine incision was inspected and found to be  hemostatic.  The fascia was then closed with a 0-Vicryl suture in a running  fashion.  Subcutaneous tissue was irrigated, any bleeders cauterized, and  the skin closed using clips.  20 cubic centimeters of 0.5% Marcaine plain  were then used to infiltrate the incision.  All instrument and lap counts  were correct x 3.  The patient was awakened and taken to the recovery room  in stable condition.           ______________________________  Shelbie Proctor Shawnie Pons, M.D.     TSP/MEDQ  D:  07/04/2006  T:  07/05/2006  Job:  [id number]

## 2011-07-30 LAB — CBC
HCT: 23.7 — ABNORMAL LOW
HCT: 23.7 — ABNORMAL LOW
Hemoglobin: 7.4 — CL
Hemoglobin: 7.6 — CL
MCHC: 32
Platelets: 252
Platelets: 262
RBC: 3.71 — ABNORMAL LOW
RDW: 16.7 — ABNORMAL HIGH
WBC: 15 — ABNORMAL HIGH
WBC: 11.5 — ABNORMAL HIGH
WBC: 16.4 — ABNORMAL HIGH

## 2011-07-30 LAB — KLEIHAUER-BETKE STAIN: Fetal Cells %: 0

## 2011-07-30 LAB — RPR: RPR Ser Ql: NONREACTIVE

## 2011-08-18 LAB — URINALYSIS, ROUTINE W REFLEX MICROSCOPIC
Bilirubin Urine: NEGATIVE
Glucose, UA: NEGATIVE
Hgb urine dipstick: NEGATIVE
Specific Gravity, Urine: 1.01
Urobilinogen, UA: 0.2

## 2011-09-09 ENCOUNTER — Encounter: Payer: Self-pay | Admitting: Pulmonary Disease

## 2011-09-10 ENCOUNTER — Ambulatory Visit (INDEPENDENT_AMBULATORY_CARE_PROVIDER_SITE_OTHER): Payer: Self-pay | Admitting: Internal Medicine

## 2011-09-10 ENCOUNTER — Encounter: Payer: Self-pay | Admitting: Internal Medicine

## 2011-09-10 VITALS — BP 122/88 | HR 88 | Temp 98.2°F | Ht 69.0 in | Wt 189.0 lb

## 2011-09-10 DIAGNOSIS — R05 Cough: Secondary | ICD-10-CM

## 2011-09-10 NOTE — Patient Instructions (Addendum)
1. Stop advair today 2. Continue delsym as needed for cough. You can also use albuterol but not more than 3-4 times daily 3. In 2 weeks have methacholine challenge test (you cannot be pregnant for this test). Need to be off advair for this test 4. REturn to followup after methacholine challenge test 5. Bring the actual copy of your chest xray from your doctor's office when you get back - not the result but the actual film  6. Any worsening call us anytime or return for visit

## 2011-09-10 NOTE — Progress Notes (Signed)
Subjective:    Patient ID: Elizabeth Chen, female    DOB: 1986-06-08, 25 y.o.   MRN: [id number]  HPI  25 year old AA female. Non-smoker. Strong family hx of asthma (brother and daughter). 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 Is on phone entire time except for 30 min lunch break. She has hx of childhood asthma but outgrew  In early July 2012 developed cough. Insidious onset. Does not remember URI but thinks cough was related to that. Associated dyspnea + at onset. In early August 2012: saw Dr Cyndia Bent. CXR reportedly showed pneumonia. Was given antibiotics and 10 day steroids and advair with albuterol. Felt better for a week but then cough recurred.  Cough and dyspnea made significantly worse by cold air (in office wearing mask). Has been on albuterol since august for prn use but using it atleast 3 times a day; this helps significantly but still feels dyspneic. Denies associated wheezing. Has nocturnal cough - wakes up multiple times each night and uses albuterol. Dyspnea is non-exertional Symptoms persisted and referred to pulmonary in late august but seeing PCCM only today; she does not know reason for delay. Since the referral was made she has been staying at home mostly, not visited pmd and not gone to work. Of note, she is not sure if advair disc since august 2012 is helping.   Denies associated sinus drainage, gerd, hypertension, ace inhibitor intake, and  repeat cxr. Spirometry today is normal.   Spirometry in office today is normal   Review of Systems  Constitutional: Positive for unexpected weight change. Negative for fever.  HENT: Positive for congestion. Negative for ear pain, sore throat, rhinorrhea, sneezing, trouble swallowing, dental problem, postnasal drip and sinus pressure.   Eyes: Negative for itching.  Respiratory: Positive for cough, chest tightness, shortness of breath and wheezing. Negative for apnea, choking and stridor.     Cardiovascular: Negative for palpitations and leg swelling.  Gastrointestinal: Negative for nausea and vomiting.  Genitourinary: Negative for dysuria.  Musculoskeletal: Negative for joint swelling.  Skin: Negative for rash.  Neurological: Negative for dizziness and headaches.  Hematological: Bruises/bleeds easily.  Psychiatric/Behavioral: Negative for dysphoric mood. The patient is nervous/anxious.        Objective:   Physical Exam  Vitals reviewed. Constitutional: She is oriented to person, place, and time. She appears well-developed and well-nourished. No distress.       Wearing mask in office to prevent inhalation of cold air  HENT:  Head: Normocephalic and atraumatic.  Right Ear: External ear normal.  Left Ear: External ear normal.  Mouth/Throat: Oropharynx is clear and moist. No oropharyngeal exudate.  Eyes: Conjunctivae and EOM are normal. Pupils are equal, round, and reactive to light. Right eye exhibits no discharge. Left eye exhibits no discharge. No scleral icterus.  Neck: Normal range of motion. Neck supple. No JVD present. No tracheal deviation present. No thyromegaly present.       Hoarse voice  Cardiovascular: Normal rate, regular rhythm, normal heart sounds and intact distal pulses.  Exam reveals no gallop and no friction rub.   No murmur heard. Pulmonary/Chest: Effort normal and breath sounds normal. No respiratory distress. She has no wheezes. She has no rales. She exhibits no tenderness.       Panting style of conversation  Abdominal: Soft. Bowel sounds are normal. She exhibits no distension and no mass. There is no tenderness. There is no rebound and no guarding.  Musculoskeletal: Normal range of motion. She exhibits no edema and no tenderness.       Palms are sweaty and moist  Lymphadenopathy:    She has no cervical adenopathy.  Neurological: She is alert and oriented to person, place, and time. She has normal reflexes. No cranial nerve deficit. She exhibits  normal muscle tone. Coordination normal.  Skin: Skin is warm and dry. No rash noted. She is not diaphoretic. No erythema. No pallor.       tatoos  Psychiatric: Judgment and thought content normal.       anxious          Assessment & Plan:

## 2011-09-10 NOTE — Assessment & Plan Note (Signed)
?   LPR cough, ? Asthma, ? VCD, ? GERD Related  PLAN 1. Stop advair today 2. Continue delsym as needed for cough. You can also use albuterol but not more than 3-4 times daily 3. In 2 weeks have methacholine challenge test (you cannot be pregnant for this test). Need to be off advair for this test 4. REturn to followup after methacholine challenge test 5. Bring the actual copy of your chest xray from your doctor's office when you get back - not the result but the actual film  6. Any worsening call us anytime or return for visit  She is agreeable with above plan

## 2011-09-22 ENCOUNTER — Telehealth: Payer: Self-pay | Admitting: Pulmonary Disease

## 2011-09-22 NOTE — Telephone Encounter (Signed)
Elizabeth Chen have you seen any forms on this pt? Please advise, thanks

## 2011-09-22 NOTE — Telephone Encounter (Signed)
I have not seen this paperwork. I will look for it tomorrow. Carron Curie, CMA

## 2011-09-23 NOTE — Telephone Encounter (Signed)
LMTCB with [employer] to see if they have the papers because I have not seen them. Carron Curie, CMA

## 2011-09-24 NOTE — Telephone Encounter (Signed)
I spoke with [employer] and they state they do not have any paperwork on this pt. I called pt and made her aware of this and she states she will get the paperwork re-sent.

## 2011-09-25 ENCOUNTER — Ambulatory Visit (HOSPITAL_COMMUNITY)
Admission: RE | Admit: 2011-09-25 | Discharge: 2011-09-25 | Disposition: A | Discharge status: Home / Self Care | Payer: BC Managed Care – PPO | Source: Ambulatory Visit | Attending: Internal Medicine | Admitting: Internal Medicine

## 2011-09-25 DIAGNOSIS — R05 Cough: Secondary | ICD-10-CM | POA: Insufficient documentation

## 2011-09-25 DIAGNOSIS — R059 Cough, unspecified: Secondary | ICD-10-CM | POA: Insufficient documentation

## 2011-09-25 LAB — PULMONARY FUNCTION TEST

## 2011-09-25 MED ORDER — ALBUTEROL SULFATE (5 MG/ML) 0.5% IN NEBU
2.5000 mg | INHALATION_SOLUTION | Freq: Once | RESPIRATORY_TRACT | Status: AC
  Administered 2011-09-25: 2.5 mg via RESPIRATORY_TRACT

## 2011-09-25 MED ORDER — METHACHOLINE 4 MG/ML NEB SOLN: 3.0000 mL | Freq: Once | RESPIRATORY_TRACT | Status: DC

## 2011-09-25 MED ORDER — METHACHOLINE 0.25 MG/ML NEB SOLN
3.0000 mL | Freq: Once | RESPIRATORY_TRACT | Status: AC
  Administered 2011-09-25: 0.75 mg via RESPIRATORY_TRACT

## 2011-09-25 MED ORDER — METHACHOLINE 0.0625 MG/ML NEB SOLN
3.0000 mL | Freq: Once | RESPIRATORY_TRACT | Status: AC
  Administered 2011-09-25: 0.1875 mg via RESPIRATORY_TRACT

## 2011-09-25 MED ORDER — METHACHOLINE 1 MG/ML NEB SOLN: 3.0000 mL | Freq: Once | RESPIRATORY_TRACT | Status: DC

## 2011-09-25 MED ORDER — METHACHOLINE 16 MG/ML NEB SOLN: 3.0000 mL | Freq: Once | RESPIRATORY_TRACT | Status: DC

## 2011-09-25 MED ORDER — SODIUM CHLORIDE 0.9 % IN NEBU
3.0000 mL | INHALATION_SOLUTION | Freq: Once | RESPIRATORY_TRACT | Status: AC
  Administered 2011-09-25: 3 mL via RESPIRATORY_TRACT

## 2011-10-06 ENCOUNTER — Telehealth: Payer: Self-pay | Admitting: Internal Medicine

## 2011-10-06 NOTE — Telephone Encounter (Signed)
Pt is aware that any extended time out of work would need to come from the physician that put her out of work. Pt verbalized understanding of this. She did request a copy of her last OV note and this was sent.

## 2011-10-07 ENCOUNTER — Ambulatory Visit: Payer: Self-pay | Admitting: Internal Medicine

## 2011-10-21 ENCOUNTER — Ambulatory Visit (INDEPENDENT_AMBULATORY_CARE_PROVIDER_SITE_OTHER): Payer: BC Managed Care – PPO | Admitting: Internal Medicine

## 2011-10-21 ENCOUNTER — Encounter: Payer: Self-pay | Admitting: *Deleted

## 2011-10-21 ENCOUNTER — Encounter: Payer: Self-pay | Admitting: Internal Medicine

## 2011-10-21 VITALS — BP 122/98 | HR 111 | Temp 98.6°F | Ht 69.0 in | Wt 189.2 lb

## 2011-10-21 DIAGNOSIS — R05 Cough: Secondary | ICD-10-CM

## 2011-10-21 MED ORDER — BUDESONIDE-FORMOTEROL FUMARATE 160-4.5 MCG/ACT IN AERO: 2.0000 | INHALATION_SPRAY | Freq: Two times a day (BID) | RESPIRATORY_TRACT | Status: DC

## 2011-10-21 MED ORDER — MONTELUKAST SODIUM 10 MG PO TABS: 10.0000 mg | ORAL_TABLET | Freq: Every day | ORAL | Status: DC

## 2011-10-21 MED ORDER — PREDNISONE 10 MG PO TABS: ORAL_TABLET | ORAL | Status: DC

## 2011-10-21 NOTE — Progress Notes (Signed)
Subjective:    Patient ID: Elizabeth Chen, female    DOB: 19-Dec-1985, 25 y.o.   MRN: [id number]  HPI 25 year old AA female. Non-smoker. Strong family hx of asthma (brother and daughter). 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 Is on phone entire time except for 30 min lunch break. She has hx of childhood asthma but outgrew  In early July 2012 developed cough. Insidious onset. Does not remember URI but thinks cough was related to that. Associated dyspnea + at onset. In early August 2012: saw Dr Cyndia Bent. CXR reportedly showed pneumonia. Was given antibiotics and 10 day steroids and advair with albuterol. Felt better for a week but then cough recurred.  Cough and dyspnea made significantly worse by cold air (in office wearing mask). Has been on albuterol since august for prn use but using it atleast 3 times a day; this helps significantly but still feels dyspneic. Denies associated wheezing. Has nocturnal cough - wakes up multiple times each night and uses albuterol. Dyspnea is non-exertional Symptoms persisted and referred to pulmonary in late august but seeing PCCM only today; she does not know reason for delay. Since the referral was made she has been staying at home mostly, not visited pmd and not gone to work. Of note, she is not sure if advair disc since august 2012 is helping.   Denies associated sinus drainage, gerd, hypertension, ace inhibitor intake, and  repeat cxr. Spirometry today is normal.   Spirometry in office today is normal  REC 1. Stop advair today  2. Continue delsym as needed for cough. You can also use albuterol but not more than 3-4 times daily  3. In 2 weeks have methacholine challenge test (you cannot be pregnant for this test). Need to be off advair for this test  4. REturn to followup after methacholine challenge test  5. Bring the actual copy of your chest xray from your doctor's office when you get back - not the result but the  actual film  6. Any worsening call us anytime or return for visit  OV 10/21/2011 Followup test results. CXR 06/23/11 from [facility] not sent but official report was sent and is reported as normal. Methacholine challenge test 09/25/11 brought all symptoms and is positive at PD 20 of 0.2 dose. Still with cough - RSI cough score is 21 (level 5 annoyng cough and choking episodes. Level 4 - hoarsness, coughing after lying down and Level 3 - post nasal drip)  Family, Social, and Past reviewd: notes that her dtr has asthma and is on singulair     Review of Systems  Constitutional: Negative for fever and unexpected weight change.  HENT: Positive for congestion and postnasal drip. Negative for ear pain, nosebleeds, sore throat, rhinorrhea, sneezing, trouble swallowing, dental problem and sinus pressure.   Eyes: Negative for redness and itching.  Respiratory: Positive for cough, chest tightness, shortness of breath and wheezing.   Cardiovascular: Positive for palpitations. Negative for leg swelling.  Gastrointestinal: Negative for nausea and vomiting.  Genitourinary: Negative for dysuria.  Musculoskeletal: Negative for joint swelling.  Skin: Negative for rash.  Neurological: Positive for headaches.  Hematological: Bruises/bleeds easily.  Psychiatric/Behavioral: Negative for dysphoric mood. The patient is nervous/anxious.        Objective:   Physical Exam Vitals reviewed. Constitutional: She is oriented to person, place, and time. She appears well-developed and well-nourished. No distress.  Coughs periodically HENT:  Head:  Normocephalic and atraumatic.  Right Ear: External ear normal.  Left Ear: External ear normal.  Mouth/Throat: Oropharynx is clear and moist. No oropharyngeal exudate.  Eyes: Conjunctivae and EOM are normal. Pupils are equal, round, and reactive to light. Right eye exhibits no discharge. Left eye exhibits no discharge. No scleral icterus.  Neck: Normal range of motion.  Neck supple. No JVD present. No tracheal deviation present. No thyromegaly present.   Cardiovascular: Normal rate, regular rhythm, normal heart sounds and intact distal pulses.  Exam reveals no gallop and no friction rub.   No murmur heard. Pulmonary/Chest: Effort normal and breath sounds normal. No respiratory distress. She has no wheezes. She has no rales. She exhibits no tenderness.  Abdominal: Soft. Bowel sounds are normal. She exhibits no distension and no mass. There is no tenderness. There is no rebound and no guarding.  Musculoskeletal: Normal range of motion. She exhibits no edema and no tenderness.       Palms are sweaty and moist  Lymphadenopathy:    She has no cervical adenopathy.  Neurological: She is alert and oriented to person, place, and time. She has normal reflexes. No cranial nerve deficit. She exhibits normal muscle tone. Coordination normal.  Skin: Skin is warm and dry. No rash noted. She is not diaphoretic. No erythema. No pallor.       tatoos  Psychiatric: Judgment and thought content normal.       anxious         Assessment & Plan:

## 2011-10-21 NOTE — Assessment & Plan Note (Signed)
Diagnosis is asthma - likely causing your cough but this might not be the only reason We have to treat this first and then see how you do Take prednisone 40 mg daily x 2 days, then 20mg daily x 2 days, then 10mg daily x 2 days, then 5mg daily x 2 days and stop Do not take advair. Instead take symbicort 160/4.5 2 puff twice daily - take script, show technique and take sample STart singulair 10mg once daily at night Have flu shot next week Nurse will do note to be off from work for 8 days Return to see me in 5 weeks Spirometry at followup and cough score at followup  

## 2011-10-21 NOTE — Patient Instructions (Signed)
Diagnosis is asthma - likely causing your cough but this might not be the only reason We have to treat this first and then see how you do Take prednisone 40 mg daily x 2 days, then 20mg  daily x 2 days, then 10mg  daily x 2 days, then 5mg  daily x 2 days and stop Do not take advair. Instead take symbicort 160/4.5 2 puff twice daily - take script, show technique and take sample STart singulair 10mg  once daily at night Have flu shot next week Nurse will do note to be off from work for 8 days Return to see me in 5 weeks Spirometry at followup and cough score at followup

## 2011-11-05 ENCOUNTER — Telehealth: Payer: Self-pay | Admitting: Internal Medicine

## 2011-11-05 NOTE — Telephone Encounter (Signed)
Saw her 12/12 I believe and gave instruction - in that I wrote assistant to do note for her to be off work for 8 days. I am not sure there is anything else such as disability. Going through epic I note that a work off note for 8 days was probably not done.You can fax my office note to that number if that is what the patient wants  MR

## 2011-11-05 NOTE — Telephone Encounter (Signed)
Spoke with pt today and she stated that she has not bee paid all month and if she does not get her disability extended through [employer], which they will not do without the papers, then her job is going to fire her.  Pt stated that she needs these papers to be signed and faxed today.  MR please advise. thanks

## 2011-11-05 NOTE — Telephone Encounter (Signed)
Pt states she is only needing it for October 11, 2011 until the date they MR released her to go back to work. Pt states the from is to cover for her to get paid for those dates she was out of work. Pt is not needing short term disability now. Please advise Dr. Marchelle Gearing, thanks

## 2011-11-05 NOTE — Telephone Encounter (Signed)
Just spoke to Elizabeth Chen and apparently the papers are with me in my bag. I will look at them. Jennnifer did 8 day break from work after last visit. This appears to be short term disability. Could you please find out from patient why she needs short term disabilit ?  [place]  MR

## 2011-11-05 NOTE — Telephone Encounter (Signed)
Pt called back to add the following: please fax records to [phone] attn: josh "even if you do not get a response from dr. Marchelle Gearing today". Elizabeth Chen

## 2011-11-05 NOTE — Telephone Encounter (Signed)
Does she need it faxed today ? IF so, does it need my signature today ? Let me know by phone I am stepping out of cone now and can swing by offuice and sign it

## 2011-11-05 NOTE — Telephone Encounter (Signed)
I spoke with Elizabeth Chen and advised her I looked through Elizabeth Chen's paper's and did not see it so he might have it with him. Will forward to Elizabeth Chen to see if he has any paper work on pt. Please advise Elizabeth Chen, thanks

## 2011-11-05 NOTE — Telephone Encounter (Signed)
She states yes she needs it today if possible and it does require your signature. thanks

## 2011-11-05 NOTE — Telephone Encounter (Signed)
MR these papers are in a green folder that needs to be faxed back with your last ov note.  thanks

## 2011-11-06 NOTE — Telephone Encounter (Signed)
MR has dropped off signed paperwork and sent down to [employer] to have them fax it over. ATC pt to make her aware but phone stated pt is not accepting calls at this time [facility]

## 2011-11-06 NOTE — Telephone Encounter (Signed)
Pt wants to know "when her papers will be completed"? Call same #. She needs to know something "today" Elizabeth Chen

## 2011-11-06 NOTE — Telephone Encounter (Signed)
PT CALLED BACK. Elizabeth Chen

## 2011-11-06 NOTE — Telephone Encounter (Signed)
lmomtcb to make pt aware 

## 2011-11-06 NOTE — Telephone Encounter (Signed)
Pt is aware this was sent back down to [employer]

## 2011-11-06 NOTE — Telephone Encounter (Signed)
Pt returned call.  Advised form has been signed and MR will bring by the office today.  Form to be faxed to: [phone] attn [name].  Please call patient when this has been faxed at her home number.  Will hold in triage.

## 2011-11-06 NOTE — Telephone Encounter (Signed)
Am off today. Will swing by office next couple of hours and drop it off. It is signed

## 2011-11-06 NOTE — Telephone Encounter (Signed)
Pt is wanting to know the status of her paperwork. Pt states she needs to know something today. Please advise Dr. Marchelle Gearing, thanks

## 2011-11-12 ENCOUNTER — Telehealth: Payer: Self-pay | Admitting: Internal Medicine

## 2011-11-12 NOTE — Telephone Encounter (Signed)
Patient calling back.  Stating Harriett Sine needs to be called back before lunch.

## 2011-11-12 NOTE — Telephone Encounter (Signed)
LMTCB for Elizabeth Chen 

## 2011-11-13 NOTE — Telephone Encounter (Signed)
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 She states when she requested records from 10-21-11 OV she was advised to call us directly. She wanted to know what the pt diagnosis was in order to process her claim. I advised her as follows per OV on 10-21-11:   Diagnosis is asthma - likely causing your cough but this might not be the only reason  We have to treat this first and then see how you do  Take prednisone 40 mg daily x 2 days, then 20mg  daily x 2 days, then 10mg  daily x 2 days, then 5mg  daily x 2 days and stop  Do not take advair. Instead take symbicort 160/4.5 2 puff twice daily - take script, show technique and take sample  STart singulair 10mg  once daily at night  Have flu shot next week  Nurse will do note to be off from work for 8 days  Return to see me in 5 weeks  Spirometry at followup and cough score at followup  She requested a copy be faxed as well to [phone]. OV note faxed. Elizabeth Chen, CMA

## 2011-11-16 ENCOUNTER — Telehealth: Payer: Self-pay | Admitting: Internal Medicine

## 2011-11-16 NOTE — Telephone Encounter (Signed)
Spoke with pt. She states that her breathing has been worse x several days, having to use rescue med at least 3-4 times per day. She states does not feel like singulair is helping. I advised needs ov- scheduled her to see [facility] tomorrow 11/17/11 at 11:45 am and advised that she seek emergency care should her symptoms persist/worsen. Pt verbalized understanding.

## 2011-11-17 ENCOUNTER — Ambulatory Visit (INDEPENDENT_AMBULATORY_CARE_PROVIDER_SITE_OTHER): Payer: BC Managed Care – PPO | Admitting: Pulmonary Disease

## 2011-11-17 ENCOUNTER — Encounter: Payer: Self-pay | Admitting: Pulmonary Disease

## 2011-11-17 VITALS — BP 122/78 | HR 100 | Temp 98.8°F | Ht 69.0 in | Wt 186.2 lb

## 2011-11-17 DIAGNOSIS — J45909 Unspecified asthma, uncomplicated: Secondary | ICD-10-CM | POA: Insufficient documentation

## 2011-11-17 MED ORDER — OMEPRAZOLE 40 MG PO CPDR: 40.0000 mg | DELAYED_RELEASE_CAPSULE | Freq: Two times a day (BID) | ORAL | Status: DC

## 2011-11-17 MED ORDER — PREDNISONE 10 MG PO TABS: ORAL_TABLET | ORAL | Status: DC

## 2011-11-17 NOTE — Progress Notes (Signed)
  Subjective:    Patient ID: Elizabeth Chen, female    DOB: 1986-08-24, 26 y.o.   MRN: [id number]  HPI The patient comes in today for an acute sick visit.  She has documented reactive airways disease by methacholine challenge last year, and has been on aggressive treatment with a bronchodilator regimen.  She recently had increased symptoms, and Singulair was added to her regimen as well as a course of prednisone.  The patient states that her breathing definitely improved, but she was still having to use her rescue inhaler one time a day.  Proximally 3 days ago, she began to have worsening shortness of breath, chest tightness, as well as increased cough with throat clearing.  She is now using her rescue inhaler 4-6 times a day every day, with only mild relief.  She denies any nasal congestion or purulence, does not feel that she has a chest cold, and denies any symptoms suggestive of reflux disease.   Review of Systems  Constitutional: Negative for fever and unexpected weight change.  HENT: Negative for ear pain, nosebleeds, congestion, sore throat, rhinorrhea, sneezing, trouble swallowing, dental problem, postnasal drip and sinus pressure.   Eyes: Negative for redness and itching.  Respiratory: Positive for cough, chest tightness, shortness of breath and wheezing.   Cardiovascular: Negative for palpitations and leg swelling.  Gastrointestinal: Negative for nausea and vomiting.  Genitourinary: Negative for dysuria.  Musculoskeletal: Negative for joint swelling.  Skin: Negative for rash.  Neurological: Negative for headaches.  Hematological: Does not bruise/bleed easily.  Psychiatric/Behavioral: Negative for dysphoric mood. The patient is not nervous/anxious.        Objective:   Physical Exam Overweight female in no acute distress Nose without purulence or discharge noted Oropharynx clear Chest with mild decrease in breath sounds, no wheezes or rhonchi Cardiac exam with regular rate and  rhythm, positive accentuation of second heart sound. Lower extremities without edema, no cyanosis noted Alert and oriented, moves all 4 extremities.       Assessment & Plan:  \

## 2011-11-17 NOTE — Patient Instructions (Signed)
Stay on symbicort and singulair Will treat with prednisone for about a week to help get you thru this period. Will treat empirically for acid reflux, since this can be "silent", and can cause cough/chest tightness/shortness of breath/throat clearing. Keep followup with Dr. Marchelle Gearing for the end of Jan.

## 2011-11-17 NOTE — Assessment & Plan Note (Signed)
The patient has a history of asthma as documented by a positive methacholine challenge last year.  She has been on aggressive bronchodilator therapy, but has continued to have breakthrough symptoms.  At the last visit, she was changed to symbicort, Singulair was added, and she was given a short course of prednisone.  She definitely improved on the prednisone, but now her symptoms have recurred.  She continues to have chest tightness, dyspnea on exertion, and cough/throat clearing.  She has no history to suggest chronic sinus disease, and she also denies a history suggestive of reflux.  However, I have discussed with her the role of occult reflux and asthma, and how it can cause a lot of the symptoms that she is describing.  I think it is worth a trial of b.i.d. Proton pump inhibitor for the next few weeks to see if she has improvement.  Will also treat with a short prednisone taper to get her through this particular episode since she is so symptomatic.  If she continues to have issues, could also consider an allergy evaluation.

## 2011-12-07 ENCOUNTER — Ambulatory Visit: Payer: BC Managed Care – PPO | Admitting: Internal Medicine

## 2011-12-24 ENCOUNTER — Encounter: Payer: Self-pay | Admitting: Internal Medicine

## 2011-12-24 ENCOUNTER — Ambulatory Visit (INDEPENDENT_AMBULATORY_CARE_PROVIDER_SITE_OTHER): Payer: BC Managed Care – PPO | Admitting: Internal Medicine

## 2011-12-24 VITALS — BP 120/86 | HR 102 | Temp 98.6°F | Ht 69.0 in | Wt 183.0 lb

## 2011-12-24 DIAGNOSIS — J45901 Unspecified asthma with (acute) exacerbation: Secondary | ICD-10-CM

## 2011-12-24 DIAGNOSIS — J45909 Unspecified asthma, uncomplicated: Secondary | ICD-10-CM

## 2011-12-24 MED ORDER — LEVOFLOXACIN 500 MG PO TABS: 500.0000 mg | ORAL_TABLET | Freq: Every day | ORAL | Status: AC

## 2011-12-24 MED ORDER — FLUTICASONE PROPIONATE 50 MCG/ACT NA SUSP: 1.0000 | Freq: Every day | NASAL | Status: DC

## 2011-12-24 MED ORDER — PREDNISONE 10 MG PO TABS: ORAL_TABLET | ORAL | Status: DC

## 2011-12-24 NOTE — Patient Instructions (Addendum)
You have asthma attack and sinus drainage/infection Take levaquin 500mg daily x 8 days (if too costly let us know) Please take  prednisone 40mg once daily x 3 days, then 30mg once daily x 3 days, then 20mg once daily x 3 days, then prednisone 10mg once daily  x 3 days, then prednisone 5mg daily x 3 days  and stop Please start generic fluticasone nasal steroid spray 2 squirts each nostril daily Do netti pot atleast 3-4 times a week  Please start the acid reflux treatment prescribed by Dr Clance at last visit Please [facility] flu [facility] Followup 4-6 weeks AT followup will do cough score and spirometry  

## 2011-12-24 NOTE — Progress Notes (Signed)
Subjective:    Patient ID: Elizabeth Chen, female    DOB: May 26, 1986, 26 y.o.   MRN: [id number]  HPI ACUTE OV 11/16/10: The patient comes in today for an acute sick visit.  She has documented reactive airways disease by methacholine challenge last year, and has been on aggressive treatment with a bronchodilator regimen.  She recently had increased symptoms, and Singulair was added to her regimen as well as a course of prednisone.  The patient states that her breathing definitely improved, but she was still having to use her rescue inhaler one time a day.  Proximally 3 days ago, she began to have worsening shortness of breath, chest tightness, as well as increased cough with throat clearing.  She is now using her rescue inhaler 4-6 times a day every day, with only mild relief.  She denies any nasal congestion or purulence, does not feel that she has a chest cold, and denies any symptoms suggestive of reflux disease.  Stay on symbicort and singulair  Will treat with prednisone for about a week to help get you thru this period.  Will treat empirically for acid reflux, since this can be "silent", and can cause cough/chest tightness/shortness of breath/throat clearing.  Keep followup with Dr. Marchelle Gearing for the end of Jan  OV 12/24/2011 Saw Dr clance 5 weeks ago for ae asthma and also silent GERD. She neever refilled the PPI but says she was getting better but a week after finishing pred burst got cold again and since then persistent chest tightness, associated with dyspnea, wheeze and cough. All symptoms c/w Aeasthma. Using albuterol for rescue twice daily atleast. Complaints of copious clear sinus drainage and sneezing as well. Has not had flu shot. RSI cough score is 11 and spirometry is normal with fev1 2.64L/83% and ratio 88 (104%)  Past, Family, Social reviewed: no change since last visit except as noted above      Review of Systems  Constitutional: Negative for fever and unexpected weight  change.  HENT: Negative for ear pain, nosebleeds, congestion, sore throat, rhinorrhea, sneezing, trouble swallowing, dental problem, postnasal drip and sinus pressure.   Eyes: Negative for redness and itching.  Respiratory: Positive for cough, chest tightness, shortness of breath and wheezing.   Cardiovascular: Negative for palpitations and leg swelling.  Gastrointestinal: Negative for nausea and vomiting.  Genitourinary: Negative for dysuria.  Musculoskeletal: Negative for joint swelling.  Skin: Negative for rash.  Neurological: Negative for headaches.  Hematological: Does not bruise/bleed easily.  Psychiatric/Behavioral: Negative for dysphoric mood. The patient is not nervous/anxious.        Objective:   Physical Exam  Vitals reviewed. Constitutional: She is oriented to person, place, and time. She appears well-developed and well-nourished. No distress.       Body mass index is 27.02 kg/(m^2).   HENT:  Head: Normocephalic and atraumatic.  Right Ear: External ear normal.  Left Ear: External ear normal.  Mouth/Throat: Oropharynx is clear and moist. No oropharyngeal exudate.       Copious post nasal drip +  Eyes: Conjunctivae and EOM are normal. Pupils are equal, round, and reactive to light. Right eye exhibits no discharge. Left eye exhibits no discharge. No scleral icterus.  Neck: Normal range of motion. Neck supple. No JVD present. No tracheal deviation present. No thyromegaly present.  Cardiovascular: Normal rate, regular rhythm, normal heart sounds and intact distal pulses.  Exam reveals no gallop and no friction rub.   No murmur heard. Pulmonary/Chest: Effort normal and breath  sounds normal. No respiratory distress. She has no wheezes. She has no rales. She exhibits no tenderness.  Abdominal: Soft. Bowel sounds are normal. She exhibits no distension and no mass. There is no tenderness. There is no rebound and no guarding.  Musculoskeletal: Normal range of motion. She exhibits no  edema and no tenderness.  Lymphadenopathy:    She has no cervical adenopathy.  Neurological: She is alert and oriented to person, place, and time. She has normal reflexes. No cranial nerve deficit. She exhibits normal muscle tone. Coordination normal.  Skin: Skin is warm and dry. No rash noted. She is not diaphoretic. No erythema. No pallor.  Psychiatric: She has a normal mood and affect. Her behavior is normal. Judgment and thought content normal.       Mildly anxious          Assessment & Plan:

## 2011-12-24 NOTE — Assessment & Plan Note (Signed)
You have asthma attack and sinus drainage/infection Take levaquin 500mg  daily x 8 days (if too costly let us know) Please take  prednisone 40mg  once daily x 3 days, then 30mg  once daily x 3 days, then 20mg  once daily x 3 days, then prednisone 10mg  once daily  x 3 days, then prednisone 5mg  daily x 3 days  and stop Please start generic fluticasone nasal steroid spray 2 squirts each nostril daily Do netti pot atleast 3-4 times a week  Please start the acid reflux treatment prescribed by Dr Shelle Iron at last visit Please [facility] flu shot [facility] Followup 4-6 weeks AT followup will do cough score and spirometry

## 2011-12-28 ENCOUNTER — Telehealth: Payer: Self-pay | Admitting: Internal Medicine

## 2011-12-28 MED ORDER — ALBUTEROL SULFATE HFA 108 (90 BASE) MCG/ACT IN AERS: 2.0000 | INHALATION_SPRAY | Freq: Four times a day (QID) | RESPIRATORY_TRACT | Status: AC | PRN

## 2011-12-28 NOTE — Telephone Encounter (Signed)
I spoke with pt and was requesting a refill on her albuterol inhaler sent to [address]. I advised will send rx and nothing further was needed

## 2012-01-21 ENCOUNTER — Ambulatory Visit: Payer: BC Managed Care – PPO | Admitting: Internal Medicine

## 2013-03-18 ENCOUNTER — Telehealth (INDEPENDENT_AMBULATORY_CARE_PROVIDER_SITE_OTHER): Payer: Self-pay | Admitting: Surgery

## 2013-03-18 NOTE — Telephone Encounter (Signed)
Erroneous encounter

## 2013-05-17 ENCOUNTER — Encounter: Payer: Self-pay | Admitting: Obstetrics & Gynecology

## 2013-05-17 ENCOUNTER — Encounter: Payer: Self-pay | Admitting: *Deleted

## 2013-05-17 ENCOUNTER — Emergency Department (HOSPITAL_COMMUNITY)
Admission: EM | Admit: 2013-05-17 | Discharge: 2013-05-17 | Disposition: A | Discharge status: Home / Self Care | Payer: Self-pay | Attending: Emergency Medicine | Admitting: Emergency Medicine

## 2013-05-17 ENCOUNTER — Encounter (HOSPITAL_COMMUNITY): Payer: Self-pay | Admitting: Family Medicine

## 2013-05-17 ENCOUNTER — Ambulatory Visit (INDEPENDENT_AMBULATORY_CARE_PROVIDER_SITE_OTHER): Payer: No Typology Code available for payment source | Admitting: Obstetrics & Gynecology

## 2013-05-17 VITALS — BP 155/117 | HR 85 | Ht 69.0 in | Wt 184.0 lb

## 2013-05-17 DIAGNOSIS — H539 Unspecified visual disturbance: Secondary | ICD-10-CM | POA: Insufficient documentation

## 2013-05-17 DIAGNOSIS — R42 Dizziness and giddiness: Secondary | ICD-10-CM | POA: Insufficient documentation

## 2013-05-17 DIAGNOSIS — Z79899 Other long term (current) drug therapy: Secondary | ICD-10-CM | POA: Insufficient documentation

## 2013-05-17 DIAGNOSIS — J45909 Unspecified asthma, uncomplicated: Secondary | ICD-10-CM | POA: Insufficient documentation

## 2013-05-17 DIAGNOSIS — R030 Elevated blood-pressure reading, without diagnosis of hypertension: Secondary | ICD-10-CM | POA: Insufficient documentation

## 2013-05-17 DIAGNOSIS — N63 Unspecified lump in unspecified breast: Secondary | ICD-10-CM

## 2013-05-17 DIAGNOSIS — N6310 Unspecified lump in the right breast, unspecified quadrant: Secondary | ICD-10-CM

## 2013-05-17 DIAGNOSIS — Z3202 Encounter for pregnancy test, result negative: Secondary | ICD-10-CM | POA: Insufficient documentation

## 2013-05-17 DIAGNOSIS — Z86718 Personal history of other venous thrombosis and embolism: Secondary | ICD-10-CM | POA: Insufficient documentation

## 2013-05-17 LAB — POCT I-STAT, CHEM 8
Chloride: 104 mEq/L (ref 96–112)
Creatinine, Ser: 0.8 mg/dL (ref 0.50–1.10)
Hemoglobin: 13.9 g/dL (ref 12.0–15.0)
Potassium: 3.5 mEq/L (ref 3.5–5.1)
Sodium: 143 mEq/L (ref 135–145)

## 2013-05-17 NOTE — ED Provider Notes (Signed)
History    CSN: [id number] Arrival date & time 05/17/13  1555  First MD Initiated Contact with Patient 05/17/13 1758     Chief Complaint  Patient presents with  . Hypertension   (Consider location/radiation/quality/duration/timing/severity/associated sxs/prior Treatment) The history is provided by the patient. No language interpreter was used.  Elizabeth Chen is a  27 y/o F with PMHx of asthma and DVT, presenting to the ED after being instructed by PCP to go to the ED secondary to HTN. Patient reported that while at [facility] she had elevated blood pressure x 2 times - initial blood pressure was 155/117 and second blood pressure was 170/111 - occurring at around 2:00PM. Patient reported that she was in pain due to breast discomfort while at [facility]. Patient was instructed to call PCP and tell him about the blood pressure levels, patient followed instructions and PCP recommended patient to get to the ED. Patient reported that she felt dizzy, reported when walking from waiting room the to the ED room she felt unsteady in her gait and reported seeing white dots. Patient reported that she had a history of vertigo and reported that approximately 3-4 years ago she had an episode of elevated blood pressure where she was placed on anti-hypertensives where she began to black out and have syncopal episodes - stopped the medication. Denied headache, nausea, vomiting, blurred vision, sudden loss of vision, chest pain, shortness of breath, difficulty breathing, urinary symptoms, fever, chills, numbness, tingling, weakness, facial drooping, speech difficulty.  PCP Dr. Antony Haste  Past Medical History  Diagnosis Date  . Asthma     child hood dx  . Allergy history unknown   . DVT (deep venous thrombosis)    Past Surgical History  Procedure Laterality Date  . Cesarean section    . Cesarean section    . Tonsillectomy    . Ablation     Family History  Problem Relation Age of Onset  . Allergies  Mother   . Allergies Father   . Asthma Father   . Allergies Sister   . Allergies Brother   . Asthma Brother   . Asthma Daughter   . Clotting disorder      pt had dvt   History  Substance Use Topics  . Smoking status: Never Smoker   . Smokeless tobacco: Never Used  . Alcohol Use: No   OB History   Grav Para Term Preterm Abortions TAB SAB Ect Mult Living                 Review of Systems  Constitutional: Negative for fever and chills.  HENT: Negative for neck pain.   Eyes: Positive for visual disturbance.  Respiratory: Negative for chest tightness and shortness of breath.   Cardiovascular: Negative for chest pain.  Gastrointestinal: Negative for nausea, vomiting, abdominal pain and diarrhea.  Genitourinary: Negative for difficulty urinating.  Neurological: Positive for dizziness. Negative for weakness, numbness and headaches.  All other systems reviewed and are negative.    Allergies  Latex and Shrimp  Home Medications   Current Outpatient Rx  Name  Route  Sig  Dispense  Refill  . albuterol (PROVENTIL HFA;VENTOLIN HFA) 108 (90 BASE) MCG/ACT inhaler   Inhalation   Inhale 2 puffs into the lungs 4 (four) times daily as needed.   1 Inhaler   6   . montelukast (SINGULAIR) 10 MG tablet   Oral   Take 10 mg by mouth at bedtime.         Marland Kitchen  Multiple Vitamin (MULTIVITAMIN) tablet   Oral   Take 1 tablet by mouth daily.            BP 144/99  Pulse 93  Temp(Src) 98.7 F (37.1 C) (Oral)  Resp 20  SpO2 100%  LMP 04/22/2013 Physical Exam  Nursing note and vitals reviewed. Constitutional: She is oriented to person, place, and time. She appears well-developed and well-nourished. No distress.  HENT:  Head: Normocephalic and atraumatic.  Eyes: Conjunctivae and EOM are normal. Pupils are equal, round, and reactive to light. Right eye exhibits no discharge. Left eye exhibits no discharge.  Negative nystagmus  Neck: Normal range of motion. Neck supple.  Cardiovascular:  Normal rate, regular rhythm and normal heart sounds.  Exam reveals no friction rub.   No murmur heard. Pulmonary/Chest: Effort normal and breath sounds normal. No respiratory distress. She has no wheezes. She has no rales.  Musculoskeletal: Normal range of motion. She exhibits no tenderness.  Full ROM to upper and lower extremities bilaterally Strength 5+/5+ with resistance   Lymphadenopathy:    She has no cervical adenopathy.  Neurological: She is alert and oriented to person, place, and time. No cranial nerve deficit. She exhibits normal muscle tone. Coordination normal.  Cranial nerves III-XII grossly intact  Gait proper, proper balance - able to walk heel-to-toe and on toes without unsteadiness.  Negative ataxia  Skin: Skin is warm and dry. No rash noted. She is not diaphoretic. No erythema.  Psychiatric: She has a normal mood and affect. Her behavior is normal. Thought content normal.    ED Course  Procedures (including critical care time)  Orthostatics performed: sitting pulse 79 bpm, 158/99 - standing pulse 101 bpm, 145/114 - RN noted that patient felt dizzy on the side of bed and light-headed when she stood up for the orthostatic vitals  9:19PM Discussed lab findings with patient. Denied headache, dizziness, blurred vision, nausea, numbness tingling. Blood pressure 144/99.   Labs Reviewed  POCT I-STAT, CHEM 8 - Abnormal; Notable for the following:    BUN 5 (*)    Glucose, Bld 101 (*)    Calcium, Ion 1.24 (*)    All other components within normal limits  POCT PREGNANCY, URINE   No results found. 1. Elevated blood pressure   2. Asthma, unspecified asthma severity, uncomplicated   3. History of DVT (deep vein thrombosis)     MDM  Patient presenting to the ED with elevation in blood pressure with mild changes to vision with seeing white spots and feeling of unsteady gait when walking from the waiting room to the ED room. Negative neurological deficits noted. Pulses  palpable. Negative nystagmus noted. Gait proper with proper balance. Negative ataxia. Orthostatics negative findings. Negative findings on visual acuity. Negative urine pregnancy. Negative chem-8 findings. Blood pressure monitored in ED setting, some episodes of elevation, but for the most part patient's blood pressure was controlled - average was mid 130's/upper 90's. Discussed case and findings with Dr. Jinger Neighbors - cleared patient for discharge. Patient stable, afebrile. Etiology for HTN unknown. Discharged patient with referral to call PCP first thing in the morning to follow-up. Discussed with patient to get blood pressure re-checked. Discussed with patient to drink a lot of fluids, decrease salt intake. Discussed with patient that if unable to get appointment with Dr. Cyndia Bent this week to return to the ED on Friday, 05/19/2013 to get blood pressure re-checked and status. Discussed with patient to continue to monitor symptoms and if symptoms are to worsen  or change to report back to the ED - strict return instructions given. Patient agreed to plan of care, understood, all questions answered.      Raymon Mutton, PA-C 05/17/13 2233

## 2013-05-17 NOTE — Progress Notes (Signed)
.   Subjective:     Elizabeth Chen is a 27 y.o. female here for a problem exam.  Current complaints: Right breast pain for about one week.  Personal health questionnaire reviewed: yes.   Gynecologic History Patient's last menstrual period was 04/22/2013. Contraception: tubal ligation Last Pap: 2013. Results were: normal Last mammogram: N/A  Obstetric History OB History   Grav Para Term Preterm Abortions TAB SAB Ect Mult Living                   The following portions of the patient's history were reviewed and updated as appropriate: allergies, current medications, past family history, past medical history, past social history, past surgical history and problem list.  Review of Systems Pertinent items are noted in HPI.    Objective:    General appearance: alert and no distress    Right Breast:  1 cm mass and tenderness at 9 o' clock, outer quadrant.  Assessment:    Healthy female exam.    Plan:    Education reviewed: self breast exams. [facility].

## 2013-05-17 NOTE — ED Notes (Signed)
Per pt [facility] and told to come here for hypertension. Denies any associated symptoms.

## 2013-05-17 NOTE — ED Notes (Signed)
Pt reports that she has hx of HTN. Started on RX, but became hypotensive so rx was dx by PCP. Today at [facility] her BP was noted to be 170/80. Pt resting comfortably in bed. Denies any complaint at this time.

## 2013-05-17 NOTE — ED Notes (Signed)
Unable to obtain e-signature at d/c due to signature pad not working.  Pt verbalized understanding of d/c with no additional questions. VSS. Pt ambulatory at discharge.

## 2013-05-17 NOTE — ED Notes (Signed)
Pt states she felt dizzy sitting on side of bed and lightheaded when she stood up for orthostatic vitals

## 2013-05-20 NOTE — ED Provider Notes (Signed)
Medical screening examination/treatment/procedure(s) were performed by non-physician practitioner and as supervising physician I was immediately available for consultation/collaboration.   Naftula Donahue M Nivek Powley, MD 05/20/13 2004 

## 2013-05-22 ENCOUNTER — Ambulatory Visit
Admission: RE | Admit: 2013-05-22 | Discharge: 2013-05-22 | Disposition: A | Discharge status: Home / Self Care | Payer: PRIVATE HEALTH INSURANCE | Source: Ambulatory Visit | Attending: Obstetrics | Admitting: Obstetrics

## 2013-05-22 DIAGNOSIS — N6310 Unspecified lump in the right breast, unspecified quadrant: Secondary | ICD-10-CM

## 2014-12-13 ENCOUNTER — Telehealth: Payer: Self-pay | Admitting: Obstetrics

## 2014-12-14 ENCOUNTER — Telehealth: Payer: Self-pay | Admitting: Internal Medicine

## 2014-12-14 ENCOUNTER — Encounter: Payer: Self-pay | Admitting: Obstetrics

## 2014-12-14 ENCOUNTER — Ambulatory Visit (INDEPENDENT_AMBULATORY_CARE_PROVIDER_SITE_OTHER): Payer: BLUE CROSS/BLUE SHIELD | Admitting: Obstetrics

## 2014-12-14 VITALS — BP 169/117 | HR 84 | Temp 98.2°F | Ht 69.0 in | Wt 183.0 lb

## 2014-12-14 DIAGNOSIS — N939 Abnormal uterine and vaginal bleeding, unspecified: Secondary | ICD-10-CM

## 2014-12-14 DIAGNOSIS — N946 Dysmenorrhea, unspecified: Secondary | ICD-10-CM

## 2014-12-14 DIAGNOSIS — Z86718 Personal history of other venous thrombosis and embolism: Secondary | ICD-10-CM

## 2014-12-14 LAB — CBC
HEMATOCRIT: 38.7 % (ref 36.0–46.0)
HEMOGLOBIN: 12.5 g/dL (ref 12.0–15.0)
MCH: 26 pg (ref 26.0–34.0)
MCHC: 32.3 g/dL (ref 30.0–36.0)
MCV: 80.5 fL (ref 78.0–100.0)
MPV: 11 fL (ref 8.6–12.4)
Platelets: 303 10*3/uL (ref 150–400)
RBC: 4.81 MIL/uL (ref 3.87–5.11)
RDW: 13.8 % (ref 11.5–15.5)
WBC: 6.2 10*3/uL (ref 4.0–10.5)

## 2014-12-14 LAB — COMPREHENSIVE METABOLIC PANEL
ALK PHOS: 92 U/L (ref 39–117)
ALT: 9 U/L (ref 0–35)
AST: 14 U/L (ref 0–37)
Albumin: 4.2 g/dL (ref 3.5–5.2)
BILIRUBIN TOTAL: 0.2 mg/dL (ref 0.2–1.2)
BUN: 12 mg/dL (ref 6–23)
CO2: 27 mEq/L (ref 19–32)
CREATININE: 0.8 mg/dL (ref 0.50–1.10)
Calcium: 9.2 mg/dL (ref 8.4–10.5)
Chloride: 104 mEq/L (ref 96–112)
GLUCOSE: 76 mg/dL (ref 70–99)
Potassium: 4 mEq/L (ref 3.5–5.3)
SODIUM: 139 meq/L (ref 135–145)
TOTAL PROTEIN: 7.5 g/dL (ref 6.0–8.3)

## 2014-12-14 MED ORDER — PNV PRENATAL PLUS MULTIVITAMIN 27-1 MG PO TABS: 1.0000 | ORAL_TABLET | Freq: Every day | ORAL | Status: DC

## 2014-12-14 MED ORDER — OXYCODONE HCL 10 MG PO TABS: 10.0000 mg | ORAL_TABLET | Freq: Four times a day (QID) | ORAL | Status: DC | PRN

## 2014-12-14 NOTE — Progress Notes (Signed)
Patient ID: Elizabeth Chen, female   DOB: 08-Dec-1985, 29 y.o.   MRN: [id number]  Chief Complaint  Patient presents with  . Problem    heavy bleeding.     HPI Elizabeth Chen is a 29 y.o. female.  Heavy and painful periods have returned after endometrial ablation.  Had endometrial ablation done several years ago and did well until the past year with return of heavy and painful periods.  Has a history of DVT, so hormonal therapy not an option.  Patient would like definitive surgical therapy.  HPI  Past Medical History  Diagnosis Date  . Asthma     child hood dx  . Allergy history unknown   . DVT (deep venous thrombosis)     Past Surgical History  Procedure Laterality Date  . Cesarean section    . Cesarean section    . Tonsillectomy    . Ablation      Family History  Problem Relation Age of Onset  . Allergies Mother   . Allergies Father   . Asthma Father   . Allergies Sister   . Allergies Brother   . Asthma Brother   . Asthma Daughter   . Clotting disorder      pt had dvt    Social History History  Substance Use Topics  . Smoking status: Never Smoker   . Smokeless tobacco: Never Used  . Alcohol Use: No    Allergies  Allergen Reactions  . Latex     Makes skin peel  . Shrimp [Shellfish Allergy]     unknown    Current Outpatient Prescriptions  Medication Sig Dispense Refill  . albuterol (PROVENTIL HFA;VENTOLIN HFA) 108 (90 BASE) MCG/ACT inhaler Inhale 2 puffs into the lungs 4 (four) times daily as needed. 1 Inhaler 6  . montelukast (SINGULAIR) 10 MG tablet Take 10 mg by mouth at bedtime.    . Multiple Vitamin (MULTIVITAMIN) tablet Take 1 tablet by mouth daily.      . Oxycodone HCl 10 MG TABS Take 1 tablet (10 mg total) by mouth every 6 (six) hours as needed. 40 tablet 0  . Prenatal Vit-Fe Fumarate-FA (PNV PRENATAL PLUS MULTIVITAMIN) 27-1 MG TABS Take 1 tablet by mouth daily before breakfast. 30 tablet 11   No current facility-administered medications  for this visit.    Review of Systems Review of Systems Constitutional: negative for fatigue and weight loss Respiratory: negative for cough and wheezing Cardiovascular: negative for chest pain, fatigue and palpitations Gastrointestinal: negative for abdominal pain and change in bowel habits Genitourinary:negative Integument/breast: negative for nipple discharge Musculoskeletal:negative for myalgias Neurological: negative for gait problems and tremors Behavioral/Psych: negative for abusive relationship, depression Endocrine: negative for temperature intolerance     Blood pressure 169/117, pulse 84, temperature 98.2 F (36.8 C), height 5\' 9"  (1.753 m), weight 183 lb (83.008 kg), last menstrual period 12/06/2014.  Physical Exam Physical Exam: Deferred   Data Reviewed Labs Op Note from Ablation and previous surgical procedures  Assessment     AUB  Dysmenorrhea H/O DVT    Plan    General Health Panel ordered Surgical options discussed.  Patient requested referral to Dr. Delsa Sale for surgical consultation for for Robotic TLH.  Oxycodone Rx  Orders Placed This Encounter  Procedures  . Comprehensive metabolic panel  . TSH  . CBC  . Ambulatory referral to Obstetrics / Gynecology    Referral Priority:  Routine    Referral Type:  Consultation    Referral Reason:  Specialty Services Required    Requested Specialty:  Obstetrics and Gynecology    Number of Visits Requested:  1   Meds ordered this encounter  Medications  . Oxycodone HCl 10 MG TABS    Sig: Take 1 tablet (10 mg total) by mouth every 6 (six) hours as needed.    Dispense:  40 tablet    Refill:  0  . Prenatal Vit-Fe Fumarate-FA (PNV PRENATAL PLUS MULTIVITAMIN) 27-1 MG TABS    Sig: Take 1 tablet by mouth daily before breakfast.    Dispense:  30 tablet    Refill:  11

## 2014-12-14 NOTE — Telephone Encounter (Signed)
Called and spoke to pt. Appt made for 02/06/15 with MR. Pt verbalized understanding and denied any further questions or concerns at this time.

## 2014-12-14 NOTE — Telephone Encounter (Signed)
Elizabeth Chen patient not seen in a few years. Please get her in first avail  Thanks  Dr. Brand Males, M.D., [facility].C.P Pulmonary and Critical Care Medicine Staff Physician [facility] Pager: [phone], If no answer or between  15:00h - 7:00h: call [phone]  12/14/2014 12:00 PM

## 2014-12-15 LAB — TSH: TSH: 1.269 u[IU]/mL (ref 0.350–4.500)

## 2014-12-15 MED ORDER — NAPROXEN SODIUM 550 MG PO TABS: 550.0000 mg | ORAL_TABLET | Freq: Two times a day (BID) | ORAL | Status: DC

## 2014-12-17 ENCOUNTER — Telehealth: Payer: Self-pay

## 2014-12-17 NOTE — Telephone Encounter (Signed)
Faxed over notes etc for patient to see Dr. Delsa Sale at [facility] for robotic surgical consult - let her know they would call her for appt or I would

## 2014-12-18 ENCOUNTER — Encounter: Payer: Self-pay | Admitting: Obstetrics & Gynecology

## 2014-12-18 NOTE — Telephone Encounter (Signed)
Patient notified

## 2014-12-18 NOTE — Telephone Encounter (Signed)
-----   Message from Shelly Bombard, MD sent at 12/15/2014 12:18 AM EST ----- Call patient and let her know that I prescribed Naprosyn [id number], and she can pick it up from pharmacy.

## 2015-01-16 ENCOUNTER — Ambulatory Visit (INDEPENDENT_AMBULATORY_CARE_PROVIDER_SITE_OTHER): Payer: BLUE CROSS/BLUE SHIELD | Admitting: Obstetrics & Gynecology

## 2015-01-16 ENCOUNTER — Encounter: Payer: Self-pay | Admitting: Obstetrics & Gynecology

## 2015-01-16 VITALS — BP 149/108 | HR 85 | Temp 98.5°F | Ht 69.0 in | Wt 183.1 lb

## 2015-01-16 DIAGNOSIS — R030 Elevated blood-pressure reading, without diagnosis of hypertension: Secondary | ICD-10-CM | POA: Diagnosis not present

## 2015-01-16 DIAGNOSIS — IMO0001 Reserved for inherently not codable concepts without codable children: Secondary | ICD-10-CM

## 2015-01-16 DIAGNOSIS — N920 Excessive and frequent menstruation with regular cycle: Secondary | ICD-10-CM | POA: Diagnosis not present

## 2015-01-16 MED ORDER — HYDROCHLOROTHIAZIDE 12.5 MG PO CAPS: 12.5000 mg | ORAL_CAPSULE | Freq: Every day | ORAL | Status: AC

## 2015-01-16 NOTE — Progress Notes (Signed)
Subjective:     Patient ID: Elizabeth Chen, female   DOB: 05-06-1986, 29 y.o.   MRN: [id number]  HPI 29 yo AA female known to me from MD.  [id number] with c/s x2 and SVD x2.  Pt c/o heavy cycles.  She developed a DVT on nuvaring   Pt reports 4 day cycles that are very heavy.  Pt reports that her primary care physician has linked her elevated BP's with the bleeding cycle.  BP meds cause hypotension and blackouts but, BP still increase on 2 and 3.  Pt low normal the week of her cycle and the week following.    Pt s/p endometrial ablation with improvement x 2 months and now return to heavy bleeding.   Past Medical History  Diagnosis Date  . Asthma     child hood dx  . Allergy history unknown   . DVT (deep venous thrombosis)    blood clot after Nuvaring!  (2010)  Past Surgical History  Procedure Laterality Date  . Tonsillectomy    . Ablation    . Cesarean section    . Cesarean section     Current Outpatient Prescriptions on File Prior to Visit  Medication Sig Dispense Refill  . albuterol (PROVENTIL HFA;VENTOLIN HFA) 108 (90 BASE) MCG/ACT inhaler Inhale 2 puffs into the lungs 4 (four) times daily as needed. 1 Inhaler 6  . montelukast (SINGULAIR) 10 MG tablet Take 10 mg by mouth at bedtime.    . Multiple Vitamin (MULTIVITAMIN) tablet Take 1 tablet by mouth daily.      . naproxen sodium (ANAPROX DS) 550 MG tablet Take 1 tablet (550 mg total) by mouth 2 (two) times daily with a meal. 40 tablet 5  . Oxycodone HCl 10 MG TABS Take 1 tablet (10 mg total) by mouth every 6 (six) hours as needed. 40 tablet 0   No current facility-administered medications on file prior to visit.       Review of Systems     Objective:   Physical Exam BP 149/108 mmHg  Pulse 85  Temp(Src) 98.5 F (36.9 C)  Ht 5\' 9"  (1.753 m)  Wt 183 lb 1.6 oz (83.054 kg)  BMI 27.03 kg/m2  LMP 01/03/2015  CBC    Component Value Date/Time   WBC 6.2 12/14/2014 1352   WBC 5.3 02/20/2010 1440   RBC 4.81 12/14/2014 1352    RBC 4.59 02/20/2010 1440   HGB 12.5 12/14/2014 1352   HGB 10.0* 02/20/2010 1440   HCT 38.7 12/14/2014 1352   HCT 31.6* 02/20/2010 1440   PLT 303 12/14/2014 1352   PLT 274 02/20/2010 1440   MCV 80.5 12/14/2014 1352   MCV 69* 02/20/2010 1440   MCH 26.0 12/14/2014 1352   MCH 21.9* 02/20/2010 1440   MCHC 32.3 12/14/2014 1352   MCHC 31.8* 02/20/2010 1440   RDW 13.8 12/14/2014 1352   RDW 16.7* 02/20/2010 1440   LYMPHSABS 1.3 11/30/2010 1924   LYMPHSABS 2.3 02/20/2010 1440   MONOABS 1.4* 11/30/2010 1924   EOSABS 0.2 11/30/2010 1924   EOSABS 0.2 02/20/2010 1440   BASOSABS 0.1 11/30/2010 1924   BASOSABS 0.0 02/20/2010 1440        Assessment:     Menorrhagia- pt with few treatment options at present as pt with h/o DVT so cannot be on EES compounds.  She is  also s/p endometrial ablation so an IUD is not a good option.  Pt wants definitive treatment with RATH.  Has been eval by [facility]  and referred since Dr. Delsa Sale is no longer available.     Elevated BP      Plan:     12.5mg  of HCTZ Patient desires surgical management with [place] with bilateral salpingectomy.  The risks of surgery were discussed in detail with the patient including but not limited to: bleeding which may require transfusion or reoperation; infection which may require prolonged hospitalization or re-hospitalization and antibiotic therapy; injury to bowel, bladder, ureters and major vessels or other surrounding organs; need for additional procedures including laparotomy; thromboembolic phenomenon, incisional problems and other postoperative or anesthesia complications.  Patient was told that the likelihood that her condition and symptoms will be treated effectively with this surgical management was very high; the postoperative expectations were also discussed in detail. The patient also understands the alternative treatment options which were discussed in full. All questions were answered.  She was told that she will be  contacted by our surgical scheduler regarding the time and date of her surgery; routine preoperative instructions of having nothing to eat or drink after midnight on the day prior to surgery and also coming to the hospital 1 1/2 hours prior to her time of surgery were also emphasized.  She was told she may be called for a preoperative appointment about a week prior to surgery and will be given further preoperative instructions at that visit. Printed patient education handouts about the procedure were given to the patient to review at home.

## 2015-02-06 ENCOUNTER — Institutional Professional Consult (permissible substitution): Payer: BLUE CROSS/BLUE SHIELD | Admitting: Internal Medicine

## 2015-02-27 ENCOUNTER — Telehealth: Payer: Self-pay | Admitting: *Deleted

## 2015-02-27 NOTE — Telephone Encounter (Signed)
FMLA paperwork completed and faxed as requested.  Called Elizabeth Chen and notified her.

## 2015-03-25 ENCOUNTER — Encounter (HOSPITAL_COMMUNITY): Payer: Self-pay

## 2015-03-25 ENCOUNTER — Other Ambulatory Visit: Payer: Self-pay

## 2015-03-25 ENCOUNTER — Encounter (HOSPITAL_COMMUNITY)
Admission: RE | Admit: 2015-03-25 | Discharge: 2015-03-25 | Disposition: A | Discharge status: Home / Self Care | Payer: BLUE CROSS/BLUE SHIELD | Source: Ambulatory Visit | Attending: Obstetrics & Gynecology | Admitting: Obstetrics & Gynecology

## 2015-03-25 DIAGNOSIS — Z01818 Encounter for other preprocedural examination: Secondary | ICD-10-CM | POA: Diagnosis not present

## 2015-03-25 HISTORY — DX: Essential (primary) hypertension: I10

## 2015-03-25 LAB — BASIC METABOLIC PANEL
ANION GAP: 5 (ref 5–15)
BUN: 15 mg/dL (ref 6–20)
CO2: 31 mmol/L (ref 22–32)
CREATININE: 0.92 mg/dL (ref 0.44–1.00)
Calcium: 9.1 mg/dL (ref 8.9–10.3)
Chloride: 101 mmol/L (ref 101–111)
GFR calc Af Amer: >60 mL/min (ref >60)
GFR calc non Af Amer: >60 mL/min (ref >60)
GLUCOSE: 102 mg/dL — AB (ref 65–99)
Potassium: 3.4 mmol/L — ABNORMAL LOW (ref 3.5–5.1)
SODIUM: 137 mmol/L (ref 135–145)

## 2015-03-25 LAB — CBC
HCT: 39.4 % (ref 36.0–46.0)
HEMOGLOBIN: 12.8 g/dL (ref 12.0–15.0)
MCH: 26.4 pg (ref 26.0–34.0)
MCHC: 32.5 g/dL (ref 30.0–36.0)
MCV: 81.4 fL (ref 78.0–100.0)
PLATELETS: 330 10*3/uL (ref 150–400)
RBC: 4.84 MIL/uL (ref 3.87–5.11)
RDW: 13.1 % (ref 11.5–15.5)
WBC: 7.3 10*3/uL (ref 4.0–10.5)

## 2015-03-25 NOTE — Patient Instructions (Addendum)
   Your procedure is scheduled on: MAY 24 AT 730AM  Enter through the Main Entrance of [facility] at: 6AM  Pick up the phone at the desk and dial [phone] and inform us of your arrival.  Please call this number if you have any problems the morning of surgery: [phone]  Remember: Do not eat food or drink liquids after midnight: May 23 (monday)  Take these medicines the morning of surgery with a SIP OF WATER: TAKE BLOOD PRESSURE NIGHT BEFORE  Do not wear jewelry, make-up, or FINGER nail polish No metal in your hair or on your body. Do not wear lotions, powders, perfumes.  You may wear deodorant.  Do not bring valuables to the hospital. Contacts, dentures or bridgework may not be worn into surgery.  Leave suitcase in the car. After Surgery it may be brought to your room. For patients being admitted to the hospital, checkout time is 11:00am the day of discharge.

## 2015-04-01 ENCOUNTER — Encounter (HOSPITAL_COMMUNITY): Payer: Self-pay | Admitting: Anesthesiology

## 2015-04-01 NOTE — Anesthesia Preprocedure Evaluation (Addendum)
Anesthesia Evaluation  Patient identified by MRN, date of birth, ID band Patient awake    Reviewed: Allergy & Precautions, NPO status , Patient's Chart, lab work & pertinent test results, reviewed documented beta blocker date and time   Airway Mallampati: III  TM Distance: >3 FB Neck ROM: Full    Dental no notable dental hx. (+) Teeth Intact   Pulmonary asthma ,  breath sounds clear to auscultation  Pulmonary exam normal       Cardiovascular hypertension, Pt. on medications Normal cardiovascular examRhythm:Regular Rate:Normal     Neuro/Psych negative neurological ROS  negative psych ROS   GI/Hepatic negative GI ROS, Neg liver ROS,   Endo/Other  negative endocrine ROS  Renal/GU negative Renal ROS  negative genitourinary   Musculoskeletal negative musculoskeletal ROS (+)   Abdominal   Peds  Hematology negative hematology ROS (+)   Anesthesia Other Findings   Reproductive/Obstetrics Menorrhagia                            Anesthesia Physical Anesthesia Plan  ASA: II  Anesthesia Plan: General   Post-op Pain Management:    Induction: Intravenous  Airway Management Planned: Oral ETT  Additional Equipment:   Intra-op Plan:   Post-operative Plan: Extubation in OR  Informed Consent: I have reviewed the patients History and Physical, chart, labs and discussed the procedure including the risks, benefits and alternatives for the proposed anesthesia with the patient or authorized representative who has indicated his/her understanding and acceptance.   Dental advisory given  Plan Discussed with: CRNA, Anesthesiologist and Surgeon  Anesthesia Plan Comments:         Anesthesia Quick Evaluation

## 2015-04-02 ENCOUNTER — Encounter (HOSPITAL_COMMUNITY)
Admission: RE | Disposition: A | Discharge status: Home / Self Care | Payer: Self-pay | Source: Ambulatory Visit | Attending: Obstetrics & Gynecology

## 2015-04-02 ENCOUNTER — Ambulatory Visit (HOSPITAL_COMMUNITY): Payer: BLUE CROSS/BLUE SHIELD | Admitting: Anesthesiology

## 2015-04-02 ENCOUNTER — Encounter (HOSPITAL_COMMUNITY): Payer: Self-pay | Admitting: Registered Nurse

## 2015-04-02 ENCOUNTER — Ambulatory Visit (HOSPITAL_COMMUNITY)
Admission: RE | Admit: 2015-04-02 | Discharge: 2015-04-02 | Disposition: A | Discharge status: Home / Self Care | Payer: BLUE CROSS/BLUE SHIELD | Source: Ambulatory Visit | Attending: Obstetrics & Gynecology | Admitting: Obstetrics & Gynecology

## 2015-04-02 DIAGNOSIS — R102 Pelvic and perineal pain unspecified side: Secondary | ICD-10-CM

## 2015-04-02 DIAGNOSIS — G8929 Other chronic pain: Secondary | ICD-10-CM | POA: Diagnosis not present

## 2015-04-02 DIAGNOSIS — I10 Essential (primary) hypertension: Secondary | ICD-10-CM | POA: Diagnosis not present

## 2015-04-02 DIAGNOSIS — N920 Excessive and frequent menstruation with regular cycle: Secondary | ICD-10-CM

## 2015-04-02 DIAGNOSIS — N949 Unspecified condition associated with female genital organs and menstrual cycle: Secondary | ICD-10-CM

## 2015-04-02 DIAGNOSIS — N879 Dysplasia of cervix uteri, unspecified: Secondary | ICD-10-CM | POA: Diagnosis not present

## 2015-04-02 DIAGNOSIS — N800 Endometriosis of uterus: Secondary | ICD-10-CM | POA: Insufficient documentation

## 2015-04-02 DIAGNOSIS — D259 Leiomyoma of uterus, unspecified: Secondary | ICD-10-CM | POA: Insufficient documentation

## 2015-04-02 DIAGNOSIS — N924 Excessive bleeding in the premenopausal period: Secondary | ICD-10-CM | POA: Diagnosis not present

## 2015-04-02 DIAGNOSIS — Z86718 Personal history of other venous thrombosis and embolism: Secondary | ICD-10-CM | POA: Diagnosis not present

## 2015-04-02 DIAGNOSIS — Z9889 Other specified postprocedural states: Secondary | ICD-10-CM

## 2015-04-02 DIAGNOSIS — J45909 Unspecified asthma, uncomplicated: Secondary | ICD-10-CM | POA: Diagnosis not present

## 2015-04-02 HISTORY — PX: BILATERAL SALPINGECTOMY: SHX5743

## 2015-04-02 HISTORY — PX: ROBOTIC ASSISTED TOTAL HYSTERECTOMY: SHX6085

## 2015-04-02 LAB — PREGNANCY, URINE: Preg Test, Ur: NEGATIVE

## 2015-04-02 SURGERY — ROBOTIC ASSISTED TOTAL HYSTERECTOMY
Anesthesia: General | Site: Abdomen

## 2015-04-02 MED ORDER — IBUPROFEN 600 MG PO TABS: 600.0000 mg | ORAL_TABLET | Freq: Four times a day (QID) | ORAL | Status: DC | PRN

## 2015-04-02 MED ORDER — STERILE WATER FOR IRRIGATION IR SOLN
Status: DC | PRN
  Administered 2015-04-02: 3000 mL

## 2015-04-02 MED ORDER — MIDAZOLAM HCL 5 MG/5ML IJ SOLN
INTRAMUSCULAR | Status: DC | PRN
  Administered 2015-04-02: 2 mg via INTRAVENOUS

## 2015-04-02 MED ORDER — DEXAMETHASONE SODIUM PHOSPHATE 4 MG/ML IJ SOLN
INTRAMUSCULAR | Status: DC | PRN
  Administered 2015-04-02: 4 mg via INTRAVENOUS

## 2015-04-02 MED ORDER — SCOPOLAMINE 1 MG/3DAYS TD PT72: 1.0000 | MEDICATED_PATCH | Freq: Once | TRANSDERMAL | Status: DC

## 2015-04-02 MED ORDER — NEOSTIGMINE METHYLSULFATE 10 MG/10ML IV SOLN
INTRAVENOUS | Status: AC
  Filled 2015-04-02: qty 1

## 2015-04-02 MED ORDER — KETOROLAC TROMETHAMINE 30 MG/ML IJ SOLN: 30.0000 mg | Freq: Once | INTRAMUSCULAR | Status: DC | PRN

## 2015-04-02 MED ORDER — GLYCOPYRROLATE 0.2 MG/ML IJ SOLN
INTRAMUSCULAR | Status: AC
  Filled 2015-04-02: qty 3

## 2015-04-02 MED ORDER — ONDANSETRON HCL 4 MG/2ML IJ SOLN
INTRAMUSCULAR | Status: AC
  Filled 2015-04-02: qty 2

## 2015-04-02 MED ORDER — HYDROMORPHONE HCL 1 MG/ML IJ SOLN
0.2000 mg | INTRAMUSCULAR | Status: DC | PRN
  Administered 2015-04-02: 0.6 mg via INTRAVENOUS
  Filled 2015-04-02: qty 1

## 2015-04-02 MED ORDER — KETOROLAC TROMETHAMINE 30 MG/ML IJ SOLN
INTRAMUSCULAR | Status: AC
  Filled 2015-04-02: qty 1

## 2015-04-02 MED ORDER — DOCUSATE SODIUM 100 MG PO CAPS
100.0000 mg | ORAL_CAPSULE | Freq: Two times a day (BID) | ORAL | Status: DC
Start: 2015-04-02 — End: 2015-04-03

## 2015-04-02 MED ORDER — FENTANYL CITRATE (PF) 100 MCG/2ML IJ SOLN
INTRAMUSCULAR | Status: AC
  Administered 2015-04-02: 50 ug via INTRAVENOUS
  Filled 2015-04-02: qty 2

## 2015-04-02 MED ORDER — SIMETHICONE 80 MG PO CHEW: 80.0000 mg | CHEWABLE_TABLET | Freq: Four times a day (QID) | ORAL | Status: DC | PRN

## 2015-04-02 MED ORDER — ONDANSETRON HCL 4 MG PO TABS: 4.0000 mg | ORAL_TABLET | Freq: Four times a day (QID) | ORAL | Status: DC | PRN

## 2015-04-02 MED ORDER — NEOSTIGMINE METHYLSULFATE 10 MG/10ML IV SOLN
INTRAVENOUS | Status: DC | PRN
  Administered 2015-04-02: 5 mg via INTRAVENOUS

## 2015-04-02 MED ORDER — PROPOFOL 10 MG/ML IV BOLUS
INTRAVENOUS | Status: AC
  Filled 2015-04-02: qty 20

## 2015-04-02 MED ORDER — GLYCOPYRROLATE 0.2 MG/ML IJ SOLN
INTRAMUSCULAR | Status: DC | PRN
  Administered 2015-04-02: .8 mg via INTRAVENOUS

## 2015-04-02 MED ORDER — MEPERIDINE HCL 25 MG/ML IJ SOLN: 6.2500 mg | INTRAMUSCULAR | Status: DC | PRN

## 2015-04-02 MED ORDER — METOCLOPRAMIDE HCL 5 MG/ML IJ SOLN: 10.0000 mg | Freq: Once | INTRAMUSCULAR | Status: DC | PRN

## 2015-04-02 MED ORDER — FENTANYL CITRATE (PF) 100 MCG/2ML IJ SOLN
25.0000 ug | INTRAMUSCULAR | Status: DC | PRN
  Administered 2015-04-02 (×2): 50 ug via INTRAVENOUS

## 2015-04-02 MED ORDER — FENTANYL CITRATE (PF) 250 MCG/5ML IJ SOLN
INTRAMUSCULAR | Status: AC
  Filled 2015-04-02: qty 5

## 2015-04-02 MED ORDER — LACTATED RINGERS IV SOLN: INTRAVENOUS | Status: DC

## 2015-04-02 MED ORDER — MIDAZOLAM HCL 2 MG/2ML IJ SOLN
INTRAMUSCULAR | Status: AC
  Filled 2015-04-02: qty 2

## 2015-04-02 MED ORDER — LACTATED RINGERS IR SOLN
Status: DC | PRN
  Administered 2015-04-02: 3000 mL

## 2015-04-02 MED ORDER — OXYCODONE-ACETAMINOPHEN 5-325 MG PO TABS: 1.0000 | ORAL_TABLET | Freq: Four times a day (QID) | ORAL | Status: DC | PRN

## 2015-04-02 MED ORDER — BUPIVACAINE HCL (PF) 0.5 % IJ SOLN
INTRAMUSCULAR | Status: DC | PRN
  Administered 2015-04-02: 30 mL

## 2015-04-02 MED ORDER — DEXTROSE-NACL 5-0.45 % IV SOLN
INTRAVENOUS | Status: DC
  Administered 2015-04-02: 15:00:00 via INTRAVENOUS

## 2015-04-02 MED ORDER — FENTANYL CITRATE (PF) 100 MCG/2ML IJ SOLN
INTRAMUSCULAR | Status: AC
  Filled 2015-04-02: qty 2

## 2015-04-02 MED ORDER — KETOROLAC TROMETHAMINE 30 MG/ML IJ SOLN: 30.0000 mg | Freq: Four times a day (QID) | INTRAMUSCULAR | Status: DC

## 2015-04-02 MED ORDER — ALBUTEROL SULFATE (2.5 MG/3ML) 0.083% IN NEBU: 3.0000 mL | INHALATION_SOLUTION | RESPIRATORY_TRACT | Status: DC | PRN

## 2015-04-02 MED ORDER — BISACODYL 10 MG RE SUPP: 10.0000 mg | Freq: Every day | RECTAL | Status: DC | PRN

## 2015-04-02 MED ORDER — LIDOCAINE HCL (CARDIAC) 20 MG/ML IV SOLN
INTRAVENOUS | Status: AC
  Filled 2015-04-02: qty 5

## 2015-04-02 MED ORDER — ROCURONIUM BROMIDE 100 MG/10ML IV SOLN
INTRAVENOUS | Status: DC | PRN
  Administered 2015-04-02: 60 mg via INTRAVENOUS
  Administered 2015-04-02: 10 mg via INTRAVENOUS

## 2015-04-02 MED ORDER — ACETAMINOPHEN 160 MG/5ML PO SOLN
ORAL | Status: AC
Start: 2015-04-02 — End: 2015-04-02
  Filled 2015-04-02: qty 20.3

## 2015-04-02 MED ORDER — ZOLPIDEM TARTRATE 5 MG PO TABS: 5.0000 mg | ORAL_TABLET | Freq: Every evening | ORAL | Status: DC | PRN

## 2015-04-02 MED ORDER — POLYETHYLENE GLYCOL 3350 17 G PO PACK: 17.0000 g | PACK | Freq: Every day | ORAL | Status: DC | PRN

## 2015-04-02 MED ORDER — LACTATED RINGERS IV SOLN
INTRAVENOUS | Status: DC
  Administered 2015-04-02 (×2): via INTRAVENOUS

## 2015-04-02 MED ORDER — MAGNESIUM CITRATE PO SOLN
1.0000 | Freq: Once | ORAL | Status: DC | PRN
  Filled 2015-04-02: qty 296

## 2015-04-02 MED ORDER — DEXAMETHASONE SODIUM PHOSPHATE 4 MG/ML IJ SOLN
INTRAMUSCULAR | Status: AC
  Filled 2015-04-02: qty 1

## 2015-04-02 MED ORDER — OXYCODONE-ACETAMINOPHEN 5-325 MG PO TABS
1.0000 | ORAL_TABLET | ORAL | Status: DC | PRN
  Administered 2015-04-02: 1 via ORAL
  Filled 2015-04-02: qty 1

## 2015-04-02 MED ORDER — SCOPOLAMINE 1 MG/3DAYS TD PT72
MEDICATED_PATCH | TRANSDERMAL | Status: AC
  Administered 2015-04-02: 1.5 mg via TRANSDERMAL
  Filled 2015-04-02: qty 1

## 2015-04-02 MED ORDER — ONDANSETRON HCL 4 MG/2ML IJ SOLN
INTRAMUSCULAR | Status: DC | PRN
  Administered 2015-04-02: 4 mg via INTRAVENOUS

## 2015-04-02 MED ORDER — LIDOCAINE HCL (CARDIAC) 20 MG/ML IV SOLN
INTRAVENOUS | Status: DC | PRN
  Administered 2015-04-02: 50 mg via INTRAVENOUS

## 2015-04-02 MED ORDER — ARTIFICIAL TEARS OP OINT
TOPICAL_OINTMENT | OPHTHALMIC | Status: DC | PRN
  Administered 2015-04-02: 1 via OPHTHALMIC

## 2015-04-02 MED ORDER — CEFOTETAN DISODIUM 2 G IJ SOLR
2.0000 g | INTRAMUSCULAR | Status: AC
  Administered 2015-04-02: 2 g via INTRAVENOUS
  Filled 2015-04-02: qty 2

## 2015-04-02 MED ORDER — ONDANSETRON HCL 4 MG/2ML IJ SOLN: 4.0000 mg | Freq: Four times a day (QID) | INTRAMUSCULAR | Status: DC | PRN

## 2015-04-02 MED ORDER — ACETAMINOPHEN 160 MG/5ML PO SOLN
650.0000 mg | Freq: Once | ORAL | Status: AC
  Administered 2015-04-02: 650 mg via ORAL

## 2015-04-02 MED ORDER — ARTIFICIAL TEARS OP OINT
TOPICAL_OINTMENT | OPHTHALMIC | Status: AC
  Filled 2015-04-02: qty 3.5

## 2015-04-02 MED ORDER — KETOROLAC TROMETHAMINE 30 MG/ML IJ SOLN
INTRAMUSCULAR | Status: DC | PRN
  Administered 2015-04-02: 30 mg via INTRAVENOUS

## 2015-04-02 MED ORDER — FENTANYL CITRATE (PF) 100 MCG/2ML IJ SOLN
INTRAMUSCULAR | Status: DC | PRN
  Administered 2015-04-02 (×3): 50 ug via INTRAVENOUS
  Administered 2015-04-02: 100 ug via INTRAVENOUS
  Administered 2015-04-02: 150 ug via INTRAVENOUS

## 2015-04-02 MED ORDER — BUPIVACAINE HCL (PF) 0.5 % IJ SOLN
INTRAMUSCULAR | Status: AC
  Filled 2015-04-02: qty 30

## 2015-04-02 MED ORDER — PROPOFOL 10 MG/ML IV BOLUS
INTRAVENOUS | Status: DC | PRN
  Administered 2015-04-02: 200 mg via INTRAVENOUS

## 2015-04-02 MED ORDER — HYDROMORPHONE HCL 1 MG/ML IJ SOLN
INTRAMUSCULAR | Status: AC
  Filled 2015-04-02: qty 1

## 2015-04-02 MED ORDER — PANTOPRAZOLE SODIUM 40 MG PO TBEC: 40.0000 mg | DELAYED_RELEASE_TABLET | Freq: Every day | ORAL | Status: DC

## 2015-04-02 MED ORDER — HYDROMORPHONE HCL 1 MG/ML IJ SOLN
INTRAMUSCULAR | Status: DC | PRN
  Administered 2015-04-02 (×2): 0.5 mg via INTRAVENOUS

## 2015-04-02 MED ORDER — KETOROLAC TROMETHAMINE 30 MG/ML IJ SOLN
30.0000 mg | Freq: Four times a day (QID) | INTRAMUSCULAR | Status: DC
  Administered 2015-04-02: 30 mg via INTRAVENOUS
  Filled 2015-04-02: qty 1

## 2015-04-02 SURGICAL SUPPLY — 63 items
APL SKNCLS STERI-STRIP NONHPOA (GAUZE/BANDAGES/DRESSINGS) ×2
APPLIER CLIP 5 13 M/L LIGAMAX5 (MISCELLANEOUS)
APR CLP MED LRG 5 ANG JAW (MISCELLANEOUS)
BARRIER ADHS 3X4 INTERCEED (GAUZE/BANDAGES/DRESSINGS) IMPLANT
BENZOIN TINCTURE PRP APPL 2/3 (GAUZE/BANDAGES/DRESSINGS) ×3 IMPLANT
BRR ADH 4X3 ABS CNTRL BYND (GAUZE/BANDAGES/DRESSINGS)
CATH FOLEY 3WAY  5CC 16FR (CATHETERS) ×1
CATH FOLEY 3WAY 5CC 16FR (CATHETERS) ×2 IMPLANT
CLIP APPLIE 5 13 M/L LIGAMAX5 (MISCELLANEOUS) IMPLANT
CLOTH BEACON ORANGE TIMEOUT ST (SAFETY) ×3 IMPLANT
CONT PATH 16OZ SNAP LID 3702 (MISCELLANEOUS) ×3 IMPLANT
COVER BACK TABLE 60X90IN (DRAPES) ×6 IMPLANT
COVER TIP SHEARS 8 DVNC (MISCELLANEOUS) ×2 IMPLANT
COVER TIP SHEARS 8MM DA VINCI (MISCELLANEOUS) ×1
DECANTER SPIKE VIAL GLASS SM (MISCELLANEOUS) ×3 IMPLANT
DEVICE TROCAR PUNCTURE CLOSURE (ENDOMECHANICALS) IMPLANT
DRAPE WARM FLUID 44X44 (DRAPE) ×3 IMPLANT
DRSG OPSITE POSTOP 3X4 (GAUZE/BANDAGES/DRESSINGS) ×1 IMPLANT
DURAPREP 26ML APPLICATOR (WOUND CARE) ×3 IMPLANT
ELECT REM PT RETURN 9FT ADLT (ELECTROSURGICAL) ×3
ELECTRODE REM PT RTRN 9FT ADLT (ELECTROSURGICAL) ×2 IMPLANT
GAUZE VASELINE 3X9 (GAUZE/BANDAGES/DRESSINGS) IMPLANT
GLOVE BIO SURGEON STRL SZ7 (GLOVE) ×6 IMPLANT
GLOVE BIOGEL PI IND STRL 7.0 (GLOVE) ×8 IMPLANT
GLOVE BIOGEL PI INDICATOR 7.0 (GLOVE) ×4
KIT ACCESSORY DA VINCI DISP (KITS) ×1
KIT ACCESSORY DVNC DISP (KITS) ×2 IMPLANT
LEGGING LITHOTOMY PAIR STRL (DRAPES) ×3 IMPLANT
LIQUID BAND (GAUZE/BANDAGES/DRESSINGS) ×3 IMPLANT
NEEDLE HYPO 22GX1.5 SAFETY (NEEDLE) ×3 IMPLANT
NEEDLE INSUFFLATION 120MM (ENDOMECHANICALS) ×3 IMPLANT
OCCLUDER COLPOPNEUMO (BALLOONS) ×3 IMPLANT
PACK ROBOT WH (CUSTOM PROCEDURE TRAY) ×3 IMPLANT
PACK ROBOTIC GOWN (GOWN DISPOSABLE) ×3 IMPLANT
PAD POSITIONER PINK NONSTERILE (MISCELLANEOUS) ×3 IMPLANT
PAD PREP 24X48 CUFFED NSTRL (MISCELLANEOUS) ×6 IMPLANT
SET CYSTO W/LG BORE CLAMP LF (SET/KITS/TRAYS/PACK) ×1 IMPLANT
SET IRRIG TUBING LAPAROSCOPIC (IRRIGATION / IRRIGATOR) ×3 IMPLANT
SET TRI-LUMEN FLTR TB AIRSEAL (TUBING) ×1 IMPLANT
STRIP CLOSURE SKIN 1/2X4 (GAUZE/BANDAGES/DRESSINGS) ×3 IMPLANT
SURGIFLO W/THROMBIN 8M KIT (HEMOSTASIS) IMPLANT
SUT VIC AB 0 CT1 27 (SUTURE) ×12
SUT VIC AB 0 CT1 27XBRD ANBCTR (SUTURE) ×4 IMPLANT
SUT VIC AB 0 CT1 27XBRD ANTBC (SUTURE) IMPLANT
SUT VICRYL 0 UR6 27IN ABS (SUTURE) ×6 IMPLANT
SUT VICRYL 4-0 PS2 18IN ABS (SUTURE) ×6 IMPLANT
SUT VLOC 180 0 9IN  GS21 (SUTURE)
SUT VLOC 180 0 9IN GS21 (SUTURE) IMPLANT
SYR CONTROL 10ML LL (SYRINGE) ×3 IMPLANT
SYSTEM CONVERTIBLE TROCAR (TROCAR) ×1 IMPLANT
TIP RUMI ORANGE 6.7MMX12CM (TIP) IMPLANT
TIP UTERINE 5.1X6CM LAV DISP (MISCELLANEOUS) IMPLANT
TIP UTERINE 6.7X10CM GRN DISP (MISCELLANEOUS) IMPLANT
TIP UTERINE 6.7X6CM WHT DISP (MISCELLANEOUS) ×1 IMPLANT
TIP UTERINE 6.7X8CM BLUE DISP (MISCELLANEOUS) IMPLANT
TOWEL OR 17X24 6PK STRL BLUE (TOWEL DISPOSABLE) ×6 IMPLANT
TROCAR DILATING TIP 12MM 150MM (ENDOMECHANICALS) ×3 IMPLANT
TROCAR DISP BLADELESS 8 DVNC (TROCAR) ×4 IMPLANT
TROCAR DISP BLADELESS 8MM (TROCAR) ×2
TROCAR PORT AIRSEAL 5X120 (TROCAR) ×1 IMPLANT
TROCAR XCEL 12X100 BLDLESS (ENDOMECHANICALS) ×1 IMPLANT
TROCAR XCEL NON-BLD 5MMX100MML (ENDOMECHANICALS) ×3 IMPLANT
WATER STERILE IRR 1000ML POUR (IV SOLUTION) ×9 IMPLANT

## 2015-04-02 NOTE — Discharge Instructions (Signed)
Total Laparoscopic Hysterectomy, Care After °Refer to this sheet in the next few weeks. These instructions provide you with information on caring for yourself after your procedure. Your health care provider may also give you more specific instructions. Your treatment has been planned according to current medical practices, but problems sometimes occur. Call your health care provider if you have any problems or questions after your procedure. °WHAT TO EXPECT AFTER THE PROCEDURE °· Pain and bruising at the incision sites. You will be given pain medicine to control it. °· Menopausal symptoms such as hot flashes, night sweats, and insomnia if your ovaries were removed. °· Sore throat from the breathing tube that was inserted during surgery. °HOME CARE INSTRUCTIONS °· Only take over-the-counter or prescription medicines for pain, discomfort, or fever as directed by your health care provider.   °· Do not take aspirin. It can cause bleeding.   °· Do not drive when taking pain medicine.   °· Follow your health care provider's advice regarding diet, exercise, lifting, driving, and general activities.   °· Resume your usual diet as directed and allowed.   °· Get plenty of rest and sleep.   °· Do not douche, use tampons, or have sexual intercourse for at least 6 weeks, or until your health care provider gives you permission.   °· Change your bandages (dressings) as directed by your health care provider.   °· Monitor your temperature and notify your health care provider of a fever.   °· Take showers instead of baths for 2-3 weeks.   °· Do not drink alcohol until your health care provider gives you permission.   °· If you develop constipation, you may take a mild laxative with your health care provider's permission. Bran foods may help with constipation problems. Drinking enough fluids to keep your urine clear or pale yellow may help as well.   °· Try to have someone home with you for 1-2 weeks to help around the house.    °· Keep all of your follow-up appointments as directed by your health care provider.   °SEEK MEDICAL CARE IF: °· You have swelling, redness, or increasing pain around your incision sites.   °· You have pus coming from your incision.   °· You notice a bad smell coming from your incision.   °· Your incision breaks open.   °· You feel dizzy or lightheaded.   °· You have pain or bleeding when you urinate.   °· You have persistent diarrhea.   °· You have persistent nausea and vomiting.   °· You have abnormal vaginal discharge.   °· You have a rash.   °· You have any type of abnormal reaction or develop an allergy to your medicine.   °· You have poor pain control with your prescribed medicine.   °SEEK IMMEDIATE MEDICAL CARE IF: °· You have chest pain or shortness of breath. °· You have severe abdominal pain that is not relieved with pain medicine. °· You have pain or swelling in your legs. °MAKE SURE YOU: °· Understand these instructions. °· Will watch your condition. °· Will get help right away if you are not doing well or get worse. °Document Released: 08/16/2013 Document Revised: 10/31/2013 Document Reviewed: 08/16/2013 °Exit[employer]® Patient Information ©2015 [employer]. This information is not intended to replace advice given to you by your health care provider. Make sure you discuss any questions you have with your health care provider. ° °

## 2015-04-02 NOTE — H&P (Signed)
HPI 29 yo AA female known to me from [place]. [id number] with c/s x2 and SVD x2. Pt c/o heavy cycles. She developed a DVT on nuvaring Pt reports 4 day cycles that are very heavy. Pt reports that her primary care physician has linked her elevated BP's with the bleeding cycle. BP meds cause hypotension and blackouts but, BP still increase on 2 and 3. Pt low normal the week of her cycle and the week following. Pt s/p endometrial ablation with improvement x 2 months and now return to heavy bleeding.   Past Medical History  Diagnosis Date  . Asthma     child hood dx  . Allergy history unknown   . DVT (deep venous thrombosis)    blood clot after Nuvaring! (2010)  Past Surgical History  Procedure Laterality Date  . Tonsillectomy    . Ablation    . Cesarean section    . Cesarean section     Current Outpatient Prescriptions on File Prior to Visit  Medication Sig Dispense Refill  . albuterol (PROVENTIL HFA;VENTOLIN HFA) 108 (90 BASE) MCG/ACT inhaler Inhale 2 puffs into the lungs 4 (four) times daily as needed. 1 Inhaler 6  . montelukast (SINGULAIR) 10 MG tablet Take 10 mg by mouth at bedtime.    . Multiple Vitamin (MULTIVITAMIN) tablet Take 1 tablet by mouth daily.     . naproxen sodium (ANAPROX DS) 550 MG tablet Take 1 tablet (550 mg total) by mouth 2 (two) times daily with a meal. 40 tablet 5  . Oxycodone HCl 10 MG TABS Take 1 tablet (10 mg total) by mouth every 6 (six) hours as needed. 40 tablet 0   No current facility-administered medications on file prior to visit.       Review of Systems     Objective:   Physical Exam BP 132/96 mmHg  Pulse 87  Temp(Src) 98.1 F (36.7 C) (Oral)  Resp 18  Ht 5\' 9"  (1.753 m)  Wt 183 lb (83.008 kg)  BMI 27.01 kg/m2  SpO2 98%   CBC  Labs (Brief)       Component Value Date/Time   WBC 6.2 12/14/2014 1352   WBC 5.3 02/20/2010 1440   RBC 4.81  12/14/2014 1352   RBC 4.59 02/20/2010 1440   HGB 12.5 12/14/2014 1352   HGB 10.0* 02/20/2010 1440   HCT 38.7 12/14/2014 1352   HCT 31.6* 02/20/2010 1440   PLT 303 12/14/2014 1352   PLT 274 02/20/2010 1440   MCV 80.5 12/14/2014 1352   MCV 69* 02/20/2010 1440   MCH 26.0 12/14/2014 1352   MCH 21.9* 02/20/2010 1440   MCHC 32.3 12/14/2014 1352   MCHC 31.8* 02/20/2010 1440   RDW 13.8 12/14/2014 1352   RDW 16.7* 02/20/2010 1440   LYMPHSABS 1.3 11/30/2010 1924   LYMPHSABS 2.3 02/20/2010 1440   MONOABS 1.4* 11/30/2010 1924   EOSABS 0.2 11/30/2010 1924   EOSABS 0.2 02/20/2010 1440   BASOSABS 0.1 11/30/2010 1924   BASOSABS 0.0 02/20/2010 1440          Assessment:     Menorrhagia- pt with few treatment options at present as pt with h/o DVT so cannot be on EES compounds. She is also s/p endometrial ablation so an IUD is not a good option. Pt wants definitive treatment with RATH. Has been eval by [facility] and referred since Dr. Delsa Sale is no longer available.  Elevated BP     Plan:      Patient desires surgical management with [facility]  with bilateral salpingectomy. The risks of surgery were discussed in detail with the patient including but not limited to: bleeding which may require transfusion or reoperation; infection which may require prolonged hospitalization or re-hospitalization and antibiotic therapy; injury to bowel, bladder, ureters and major vessels or other surrounding organs; need for additional procedures including laparotomy; thromboembolic phenomenon, incisional problems and other postoperative or anesthesia complications. Patient was told that the likelihood that her condition and symptoms will be treated effectively with this surgical management was very high; the postoperative expectations were also discussed in detail. The patient also understands the alternative treatment  options which were discussed in full. All questions were answered.

## 2015-04-02 NOTE — Progress Notes (Signed)
Pt A/Ox4. Pt vital signs WNL. Discharge instructions reviewed, questions answered. Pt given RX for percocet. Pt belongings accounted for. Pt accompanied to vehicle via wheelchair.

## 2015-04-02 NOTE — Anesthesia Postprocedure Evaluation (Signed)
  Anesthesia Post-op Note  Patient: Elizabeth Chen  Procedure(s) Performed: Procedure(s): ROBOTIC ASSISTED TOTAL HYSTERECTOMY (N/A) BILATERAL SALPINGECTOMY (Bilateral)  Patient Location: PACU  Anesthesia Type:General  Level of Consciousness: awake, alert  and oriented  Airway and Oxygen Therapy: Patient Spontanous Breathing  Post-op Pain: mild  Post-op Assessment: Post-op Vital signs reviewed, Patient's Cardiovascular Status Stable, Respiratory Function Stable, Patent Airway, No signs of Nausea or vomiting and Pain level controlled  Post-op Vital Signs: Reviewed and stable  Last Vitals:  Filed Vitals:   04/02/15 1045  BP: 120/79  Pulse: 85  Temp: 36.5 C  Resp: 16    Complications: No apparent anesthesia complications

## 2015-04-02 NOTE — Op Note (Signed)
04/02/2015  9:43 AM  PATIENT:  Elizabeth Chen  29 y.o. female  PRE-OPERATIVE DIAGNOSIS:   Menorrhagia; fibroids, pelvic pain  POST-OPERATIVE DIAGNOSIS:  Same  PROCEDURE:  Procedure(s): ROBOTIC ASSISTED TOTAL HYSTERECTOMY (N/A) BILATERAL SALPINGECTOMY (Bilateral)  SURGEON:  Surgeon(s) and Role:    * Lavonia Drafts, MD - Primary    * Mora Bellman, MD - Assisting  ANESTHESIA:   general  EBL:  Total I/O In: 1000 [I.V.:1000] Out: 150 [Urine:100; Blood:50]  BLOOD ADMINISTERED:none  DRAINS: none   LOCAL MEDICATIONS USED:  MARCAINE     SPECIMEN:  Source of Specimen:  uterus, cervix and fallopian tubes bilaterally   DISPOSITION OF SPECIMEN:  PATHOLOGY  COUNTS:  YES  TOURNIQUET:  * No tourniquets in log *  DICTATION: .Note written in EPIC  PLAN OF CARE: Discharge to home after prolonged observation  PATIENT DISPOSITION:  PACU - hemodynamically stable.   Delay start of Pharmacological VTE agent (>24hrs) due to surgical blood loss or risk of bleeding: not applicable  Complications: none immediate  Indications: 29 yo G4P4 with h/o c-section x 2. Who presented with pelvic pain and AUB not responsive to conservative measures.  She is s/p a DVT due to EES and is worried about further clots with hormone therapy.  Findings: 8 week sized uterus, fallopian tubes that are s/p previous BTL and normal ovaries bilaterally.     The risks, benefits, and alternatives of surgery were explained, understood, and accepted. Consents were signed. All questions were answered. She was taken to the operating room and general anesthesia was applied without complication. She was placed in the dorsal lithotomy position and her abdomen and vagina were prepped and draped after she had been carefully positioned on the table. A bimanual exam revealed a 8-10 week size uterus that was mobile. Her adnexa were not enlarged. The cervix was measured and the uterus was sounded to 7 cm. A Rumi uterine  manipulator was placed without difficulty. A Foley catheter was placed and it drained clear throughout the case. Gloves were changed and attention was turned to the abdomen. A 52mm incision was made 99IP above the umbilicus and a Veress needle was placed intraperitoneally. CO2 was used to insufflate the abdomen to approximately 4 L. After good pneumoperitoneum was established, a 12 mm trocar was placed in the umbilicus.  Laparoscopy confirmed correct placement. She was placed in Trendelenburg position and ports were placed in appropriate positions on her abdomen to allow maximum exposure during the robotic case. Specifically there was an 66mm assistant port placed in the left lower quadrant under direct laproscopic visualization. Two 8 mm ports were placed 8cm lateral to the midline port and a 5 mm trocar was placed in the left upper quadrant.  These were all placed under direct laparoscopic visualization. The robot was docked and I proceeded with a robotic portion of the case.  The pelvis was inspected and the uterus was found to have fibroids and be enlarged.  The fallopian tubes and ovaries were found to be normal. The remainder of her pelvis appeared normal with the exception of adhesions of bowel to the adnexa and sidewall on the left side.  These were released sharply. The ureters and the infundibulopelvic ligaments were identified. I excised the fallopian tubes bilaterally. The round ligament on each side was cauterized and cut. The PK/gyrus instrument was used for this portion. The round ligaments were identified, cauterized and ligated, a bladder flap was created anteriorly. The uterine vessels were identified and cauterized  and then cut.The bladder was pushed out of the operative site and an anterior colpotomy was made. The colpotomy incision was extended circumferentially, following the blue outline of the Rumi manipulator. All pedicles were hemostatic.  The uterus was removed from the vagina with the  fallopian tube segments. The vaginal cuff was closed with v-lock suture.  Excellent hemostasis was noted throughout. The pelvis was irrigated. The intraabdominal pressure was lowered assess hemostasis. After determining excellent hemostasis, the robot was undocked. The midline fascial incision was closed with 0 vicryl.  The skin from all of the other ports was closed with Dermabond.  30cc of 0.5% Marcaine was injected into the port sites.  The patient was then extubated and taken to recovery in stable condition.   Sponge, lap and needle counts were correct x 2.

## 2015-04-02 NOTE — Brief Op Note (Signed)
04/02/2015  9:43 AM  PATIENT:  Elizabeth Chen  29 y.o. female  PRE-OPERATIVE DIAGNOSIS:   Menorrhagia; fibroids, pelvic pain  POST-OPERATIVE DIAGNOSIS:  Same  PROCEDURE:  Procedure(s): ROBOTIC ASSISTED TOTAL HYSTERECTOMY (N/A) BILATERAL SALPINGECTOMY (Bilateral)  SURGEON:  Surgeon(s) and Role:    * Lavonia Drafts, MD - Primary    * Mora Bellman, MD - Assisting  ANESTHESIA:   general  EBL:  Total I/O In: 1000 [I.V.:1000] Out: 150 [Urine:100; Blood:50]  BLOOD ADMINISTERED:none  DRAINS: none   LOCAL MEDICATIONS USED:  MARCAINE     SPECIMEN:  Source of Specimen:  uterus, cervix and fallopian tubes bilaterally   DISPOSITION OF SPECIMEN:  PATHOLOGY  COUNTS:  YES  TOURNIQUET:  * No tourniquets in log *  DICTATION: .Note written in EPIC  PLAN OF CARE: Discharge to home after prolonged observation  PATIENT DISPOSITION:  PACU - hemodynamically stable.   Delay start of Pharmacological VTE agent (>24hrs) due to surgical blood loss or risk of bleeding: not applicable  Complications: none immediate

## 2015-04-02 NOTE — Transfer of Care (Signed)
Immediate Anesthesia Transfer of Care Note  Patient: Elizabeth Chen  Procedure(s) Performed: Procedure(s): ROBOTIC ASSISTED TOTAL HYSTERECTOMY (N/A) BILATERAL SALPINGECTOMY (Bilateral)  Patient Location: PACU  Anesthesia Type:General  Level of Consciousness: sedated  Airway & Oxygen Therapy: Patient Spontanous Breathing and Patient connected to nasal cannula oxygen  Post-op Assessment: Report given to RN  Post vital signs: Reviewed  Last Vitals:  Filed Vitals:   04/02/15 0618  BP: 132/96  Pulse: 87  Temp: 36.7 C  Resp: 18    Complications: No apparent anesthesia complications

## 2015-04-02 NOTE — Anesthesia Postprocedure Evaluation (Signed)
  Anesthesia Post-op Note  Patient: Elizabeth Chen  Procedure(s) Performed: Procedure(s): ROBOTIC ASSISTED TOTAL HYSTERECTOMY (N/A) BILATERAL SALPINGECTOMY (Bilateral)  Patient Location: [facility]  Anesthesia Type:General  Level of Consciousness: awake  Airway and Oxygen Therapy: Patient Spontanous Breathing  Post-op Pain: mild  Post-op Assessment: Patient's Cardiovascular Status Stable and Respiratory Function Stable  Post-op Vital Signs: stable  Last Vitals:  Filed Vitals:   04/02/15 1220  BP: 119/87  Pulse: 78  Temp: 36.8 C  Resp: 16    Complications: No apparent anesthesia complications

## 2015-04-02 NOTE — Anesthesia Procedure Notes (Signed)
Procedure Name: Intubation Date/Time: 04/02/2015 7:34 AM Performed by: Talbot Grumbling Pre-anesthesia Checklist: Patient identified, Emergency Drugs available, Suction available and Patient being monitored Patient Re-evaluated:Patient Re-evaluated prior to inductionOxygen Delivery Method: Circle system utilized Preoxygenation: Pre-oxygenation with 100% oxygen Intubation Type: IV induction Ventilation: Mask ventilation without difficulty Laryngoscope Size: Glidescope (Attempt to visualize with Sabra Heck 2 without success.) Grade View: Grade II Tube type: Oral Tube size: 7.0 mm Number of attempts: 1 Airway Equipment and Method: Stylet Placement Confirmation: ETT inserted through vocal cords under direct vision,  positive ETCO2 and breath sounds checked- equal and bilateral Secured at: 21 cm Tube secured with: Tape Dental Injury: Teeth and Oropharynx as per pre-operative assessment

## 2015-04-03 ENCOUNTER — Encounter: Payer: Self-pay | Admitting: Obstetrics & Gynecology

## 2015-04-03 ENCOUNTER — Encounter (HOSPITAL_COMMUNITY): Payer: Self-pay | Admitting: Obstetrics & Gynecology

## 2015-04-04 ENCOUNTER — Telehealth: Payer: Self-pay | Admitting: Obstetrics & Gynecology

## 2015-04-04 NOTE — Telephone Encounter (Signed)
TC to pt to check on her status post op.  Left msg on answering machine.  Daelynn Blower L. Harraway-Smith, M.D., Cherlynn June

## 2015-04-04 NOTE — Discharge Summary (Signed)
Physician Discharge Summary  Patient ID: Elizabeth Chen MRN: [id number] DOB/AGE: 1985/11/29 30 y.o.  Admit date: 04/02/2015 Discharge date: 04/04/2015  Admission Diagnoses: Pelvic pain and  AUB   Discharge Diagnoses:  Principal Problem:   Menorrhagia Active Problems:   Chronic pelvic pain in female   Post-operative state   Discharged Condition: good  Hospital Course: Pt was admitted for prolonged observation after robot assisted total laparoscopic hysterectomy with bilateral salpingectomy.  Her post op course was uncomplicated.  She was able to tolerate a regular diet, ambulate and void without difficulty. She reports that she felt ready for discharge.  There are no complications to her discharge.     Consults: None  Significant Diagnostic Studies: labs: CBC  Treatments: surgery: RATH with bilateral salpingectomy  Discharge Exam: Blood pressure 119/85, pulse 93, temperature 98.7 F (37.1 C), temperature source Oral, resp. rate 18, height 5\' 9"  (1.753 m), weight 183 lb (83.008 kg), SpO2 100 %. General appearance: alert and no distress GI: soft, non-tender; bowel sounds normal; no masses,  no organomegaly and dressings dry. abd soft  Disposition: 01-Home or Self Care  Discharge Instructions    Call MD for:  difficulty breathing, headache or visual disturbances    Complete by:  As directed      Call MD for:  extreme fatigue    Complete by:  As directed      Call MD for:  hives    Complete by:  As directed      Call MD for:  persistant dizziness or light-headedness    Complete by:  As directed      Call MD for:  persistant nausea and vomiting    Complete by:  As directed      Call MD for:  severe uncontrolled pain    Complete by:  As directed      Call MD for:  temperature >100.4    Complete by:  As directed      Diet general    Complete by:  As directed      Discharge wound care:    Complete by:  As directed   Keep port sites clean and dry     Driving  Restrictions    Complete by:  As directed   No driving for 2 weeks or while on pain medications.     Increase activity slowly    Complete by:  As directed      Lifting restrictions    Complete by:  As directed   NO heavy lifting for 4 weeks     Other Restrictions    Complete by:  As directed   Nothing in vagina for 8 weeks     Sexual Activity Restrictions    Complete by:  As directed   No sexual activity for 8 weeks            Medication List    TAKE these medications        albuterol 108 (90 BASE) MCG/ACT inhaler  Commonly known as:  PROVENTIL HFA;VENTOLIN HFA  Inhale 2 puffs into the lungs 4 (four) times daily as needed.     hydrochlorothiazide 12.5 MG capsule  Commonly known as:  MICROZIDE  Take 1 capsule (12.5 mg total) by mouth daily.     ibuprofen 600 MG tablet  Commonly known as:  ADVIL,MOTRIN  Take 1 tablet (600 mg total) by mouth every 6 (six) hours as needed.     multivitamin tablet  Take 1 tablet by  mouth daily.     oxyCODONE-acetaminophen 5-325 MG per tablet  Commonly known as:  PERCOCET/ROXICET  Take 1-2 tablets by mouth every 6 (six) hours as needed.           Follow-up Information    Follow up with Lavonia Drafts, MD In 2 weeks.   Specialty:  Obstetrics and Gynecology   Contact information:   [place] [address] [phone]      I reviewed the discharge instructions with the patient.  She may be discharged this evening if she is able to void.   SignedLavonia Drafts 04/04/2015, 9:49 AM

## 2015-04-06 ENCOUNTER — Encounter: Payer: Self-pay | Admitting: Obstetrics & Gynecology

## 2015-04-17 ENCOUNTER — Encounter: Payer: Self-pay | Admitting: Obstetrics & Gynecology

## 2015-04-17 ENCOUNTER — Ambulatory Visit (INDEPENDENT_AMBULATORY_CARE_PROVIDER_SITE_OTHER): Payer: BLUE CROSS/BLUE SHIELD | Admitting: Obstetrics & Gynecology

## 2015-04-17 VITALS — BP 126/82 | HR 84 | Ht 69.0 in | Wt 181.9 lb

## 2015-04-17 DIAGNOSIS — Z9889 Other specified postprocedural states: Secondary | ICD-10-CM

## 2015-04-17 NOTE — Patient Instructions (Signed)
Total Laparoscopic Hysterectomy, Care After °Refer to this sheet in the next few weeks. These instructions provide you with information on caring for yourself after your procedure. Your health care provider may also give you more specific instructions. Your treatment has been planned according to current medical practices, but problems sometimes occur. Call your health care provider if you have any problems or questions after your procedure. °WHAT TO EXPECT AFTER THE PROCEDURE °· Pain and bruising at the incision sites. You will be given pain medicine to control it. °· Menopausal symptoms such as hot flashes, night sweats, and insomnia if your ovaries were removed. °· Sore throat from the breathing tube that was inserted during surgery. °HOME CARE INSTRUCTIONS °· Only take over-the-counter or prescription medicines for pain, discomfort, or fever as directed by your health care provider.   °· Do not take aspirin. It can cause bleeding.   °· Do not drive when taking pain medicine.   °· Follow your health care provider's advice regarding diet, exercise, lifting, driving, and general activities.   °· Resume your usual diet as directed and allowed.   °· Get plenty of rest and sleep.   °· Do not douche, use tampons, or have sexual intercourse for at least 6 weeks, or until your health care provider gives you permission.   °· Change your bandages (dressings) as directed by your health care provider.   °· Monitor your temperature and notify your health care provider of a fever.   °· Take showers instead of baths for 2-3 weeks.   °· Do not drink alcohol until your health care provider gives you permission.   °· If you develop constipation, you may take a mild laxative with your health care provider's permission. Bran foods may help with constipation problems. Drinking enough fluids to keep your urine clear or pale yellow may help as well.   °· Try to have someone home with you for 1-2 weeks to help around the house.    °· Keep all of your follow-up appointments as directed by your health care provider.   °SEEK MEDICAL CARE IF: °· You have swelling, redness, or increasing pain around your incision sites.   °· You have pus coming from your incision.   °· You notice a bad smell coming from your incision.   °· Your incision breaks open.   °· You feel dizzy or lightheaded.   °· You have pain or bleeding when you urinate.   °· You have persistent diarrhea.   °· You have persistent nausea and vomiting.   °· You have abnormal vaginal discharge.   °· You have a rash.   °· You have any type of abnormal reaction or develop an allergy to your medicine.   °· You have poor pain control with your prescribed medicine.   °SEEK IMMEDIATE MEDICAL CARE IF: °· You have chest pain or shortness of breath. °· You have severe abdominal pain that is not relieved with pain medicine. °· You have pain or swelling in your legs. °MAKE SURE YOU: °· Understand these instructions. °· Will watch your condition. °· Will get help right away if you are not doing well or get worse. °Document Released: 08/16/2013 Document Revised: 10/31/2013 Document Reviewed: 08/16/2013 °[employer]® Patient Information ©2015 [employer]. This information is not intended to replace advice given to you by your health care provider. Make sure you discuss any questions you have with your health care provider. ° °

## 2015-04-17 NOTE — Progress Notes (Signed)
Patient ID: Elizabeth Chen, female   DOB: 1986-09-19, 29 y.o.   MRN: [id number] History:  29 y.o. [id number] here today for 2 weeks post op check. She reports some shoulder pain on her right which is getting better.  She reports that she is voiding and passing stools without difficulty.  She reports adequate pain control on Naprosyn. She reports that she had a reaction to the Percocet.     The following portions of the patient's history were reviewed and updated as appropriate: allergies, current medications, past family history, past medical history, past social history, past surgical history and problem list.  Review of Systems:  A comprehensive review of systems was negative.  Objective:  Physical Exam Blood pressure 126/82, pulse 84, height 5\' 9"  (1.753 m), weight 181 lb 14.4 oz (82.509 kg), last menstrual period 01/03/2015. Gen: NAD Abd: Soft, nontender and non distended.  Port sites well healed.  steristrips were placed on the midline port.  Pelvic: not done  04/02/2015 Diagnosis Uterus and bilateral fallopian tubes - CERVIX: SQUAMOUS METAPLASIA - ENDOMETRIUM: PROLIFERATIVE WITH AREAS OF FIBROSIS. NO EVIDENCE OF HYPERPLASIA OR CARCINOMA. - MYOMETRIUM: ADENOMYOSIS. - UTERINE SEROSA: UNREMARKABLE. - BILATERAL FALLOPIAN TUBES: UNREMARKABLE.   Assessment & Plan:  F/u in 4 weeks  Gradual increase in activities

## 2015-04-21 ENCOUNTER — Encounter: Payer: Self-pay | Admitting: Obstetrics & Gynecology

## 2015-05-16 ENCOUNTER — Ambulatory Visit (INDEPENDENT_AMBULATORY_CARE_PROVIDER_SITE_OTHER): Payer: BLUE CROSS/BLUE SHIELD | Admitting: Obstetrics & Gynecology

## 2015-05-16 ENCOUNTER — Encounter: Payer: Self-pay | Admitting: Obstetrics & Gynecology

## 2015-05-16 VITALS — BP 132/99 | HR 96 | Temp 98.7°F | Ht 69.0 in | Wt 179.2 lb

## 2015-05-16 DIAGNOSIS — Z9889 Other specified postprocedural states: Secondary | ICD-10-CM

## 2015-05-16 NOTE — Patient Instructions (Signed)
Total Laparoscopic Hysterectomy, Care After °Refer to this sheet in the next few weeks. These instructions provide you with information on caring for yourself after your procedure. Your health care provider may also give you more specific instructions. Your treatment has been planned according to current medical practices, but problems sometimes occur. Call your health care provider if you have any problems or questions after your procedure. °WHAT TO EXPECT AFTER THE PROCEDURE °· Pain and bruising at the incision sites. You will be given pain medicine to control it. °· Menopausal symptoms such as hot flashes, night sweats, and insomnia if your ovaries were removed. °· Sore throat from the breathing tube that was inserted during surgery. °HOME CARE INSTRUCTIONS °· Only take over-the-counter or prescription medicines for pain, discomfort, or fever as directed by your health care provider.   °· Do not take aspirin. It can cause bleeding.   °· Do not drive when taking pain medicine.   °· Follow your health care provider's advice regarding diet, exercise, lifting, driving, and general activities.   °· Resume your usual diet as directed and allowed.   °· Get plenty of rest and sleep.   °· Do not douche, use tampons, or have sexual intercourse for at least 6 weeks, or until your health care provider gives you permission.   °· Change your bandages (dressings) as directed by your health care provider.   °· Monitor your temperature and notify your health care provider of a fever.   °· Take showers instead of baths for 2-3 weeks.   °· Do not drink alcohol until your health care provider gives you permission.   °· If you develop constipation, you may take a mild laxative with your health care provider's permission. Bran foods may help with constipation problems. Drinking enough fluids to keep your urine clear or pale yellow may help as well.   °· Try to have someone home with you for 1-2 weeks to help around the house.    °· Keep all of your follow-up appointments as directed by your health care provider.   °SEEK MEDICAL CARE IF: °· You have swelling, redness, or increasing pain around your incision sites.   °· You have pus coming from your incision.   °· You notice a bad smell coming from your incision.   °· Your incision breaks open.   °· You feel dizzy or lightheaded.   °· You have pain or bleeding when you urinate.   °· You have persistent diarrhea.   °· You have persistent nausea and vomiting.   °· You have abnormal vaginal discharge.   °· You have a rash.   °· You have any type of abnormal reaction or develop an allergy to your medicine.   °· You have poor pain control with your prescribed medicine.   °SEEK IMMEDIATE MEDICAL CARE IF: °· You have chest pain or shortness of breath. °· You have severe abdominal pain that is not relieved with pain medicine. °· You have pain or swelling in your legs. °MAKE SURE YOU: °· Understand these instructions. °· Will watch your condition. °· Will get help right away if you are not doing well or get worse. °Document Released: 08/16/2013 Document Revised: 10/31/2013 Document Reviewed: 08/16/2013 °[employer]® Patient Information ©2015 [employer]. This information is not intended to replace advice given to you by your health care provider. Make sure you discuss any questions you have with your health care provider. ° °

## 2015-05-16 NOTE — Progress Notes (Signed)
Patient ID: Elizabeth Chen, female   DOB: 14-Aug-1986, 29 y.o.   MRN: [id number] History:  29 y.o. [id number] here today for 6 week post op check.  Pt with no complaints.  She is voiding and passing stools without difficulty.  She is back at work.  The following portions of the patient's history were reviewed and updated as appropriate: allergies, current medications, past family history, past medical history, past social history, past surgical history and problem list.  Review of Systems:  A comprehensive review of systems was negative.  Objective:  Physical Exam Blood pressure 132/99, pulse 96, temperature 98.7 F (37.1 C), temperature source Oral, height 5\' 9"  (1.753 m), weight 179 lb 3.2 oz (81.285 kg), last menstrual period 01/03/2015. Gen: NAD Abd: Soft, nontender and nondistended. Port sites well healed GU: EGBUS: no lesions Vagina: no blood in vault; cuff healing well.  Suture noted at cuff. Adnexa: no masses; non tender   04/02/2015 Diagnosis Uterus and bilateral fallopian tubes - CERVIX: SQUAMOUS METAPLASIA - ENDOMETRIUM: PROLIFERATIVE WITH AREAS OF FIBROSIS. NO EVIDENCE OF HYPERPLASIA OR CARCINOMA. - MYOMETRIUM: ADENOMYOSIS. - UTERINE SEROSA: UNREMARKABLE. - BILATERAL FALLOPIAN TUBES: UNREMARKABLE. Assessment & Plan:  6 week post op check- doing well Pt to f/u in 3 months or sooner prn Gradual return to full activity. Return to intercourse in 2 weeks
